# Patient Record
Sex: Male | Born: 1935 | Race: White | Hispanic: No | Marital: Married | Smoking: Never smoker
Health system: Southern US, Community
[De-identification: ages and names within clinical notes are randomized; demographics above are authoritative.]

## PROBLEM LIST (undated history)

## (undated) DIAGNOSIS — R0600 Dyspnea, unspecified: Secondary | ICD-10-CM

## (undated) DIAGNOSIS — I4891 Unspecified atrial fibrillation: Secondary | ICD-10-CM

## (undated) DIAGNOSIS — I10 Essential (primary) hypertension: Secondary | ICD-10-CM

## (undated) DIAGNOSIS — M109 Gout, unspecified: Secondary | ICD-10-CM

## (undated) DIAGNOSIS — I340 Nonrheumatic mitral (valve) insufficiency: Secondary | ICD-10-CM

## (undated) HISTORY — DX: Gout, unspecified: M10.9

## (undated) HISTORY — DX: Dyspnea, unspecified: R06.00

## (undated) HISTORY — DX: Unspecified atrial fibrillation: I48.91

## (undated) HISTORY — DX: Nonrheumatic mitral (valve) insufficiency: I34.0

## (undated) HISTORY — PX: INGUINAL HERNIA REPAIR: SUR1180

## (undated) HISTORY — PX: OTHER SURGICAL HISTORY: SHX169

## (undated) HISTORY — DX: Essential (primary) hypertension: I10

---

## 1996-02-21 HISTORY — PX: ESOPHAGOGASTRODUODENOSCOPY: SHX1529

## 2012-04-26 DIAGNOSIS — I10 Essential (primary) hypertension: Secondary | ICD-10-CM | POA: Insufficient documentation

## 2012-04-26 DIAGNOSIS — R0689 Other abnormalities of breathing: Secondary | ICD-10-CM | POA: Insufficient documentation

## 2012-04-26 DIAGNOSIS — R0600 Dyspnea, unspecified: Secondary | ICD-10-CM | POA: Insufficient documentation

## 2012-04-26 DIAGNOSIS — I4891 Unspecified atrial fibrillation: Secondary | ICD-10-CM | POA: Insufficient documentation

## 2012-04-26 DIAGNOSIS — I059 Rheumatic mitral valve disease, unspecified: Secondary | ICD-10-CM | POA: Insufficient documentation

## 2012-05-20 ENCOUNTER — Ambulatory Visit: Payer: Self-pay | Admitting: Cardiology

## 2012-06-15 DIAGNOSIS — Z5181 Encounter for therapeutic drug level monitoring: Secondary | ICD-10-CM | POA: Insufficient documentation

## 2012-08-14 ENCOUNTER — Encounter: Payer: Self-pay | Admitting: Cardiology

## 2012-08-20 ENCOUNTER — Encounter: Payer: Self-pay | Admitting: Cardiovascular Disease

## 2012-08-20 ENCOUNTER — Ambulatory Visit (INDEPENDENT_AMBULATORY_CARE_PROVIDER_SITE_OTHER): Payer: Medicare Other | Admitting: Cardiovascular Disease

## 2012-08-20 VITALS — BP 168/89 | HR 91 | Ht 70.0 in | Wt 189.8 lb

## 2012-08-20 DIAGNOSIS — I4891 Unspecified atrial fibrillation: Secondary | ICD-10-CM

## 2012-08-20 DIAGNOSIS — I10 Essential (primary) hypertension: Secondary | ICD-10-CM

## 2012-08-20 DIAGNOSIS — I059 Rheumatic mitral valve disease, unspecified: Secondary | ICD-10-CM

## 2012-08-20 DIAGNOSIS — I340 Nonrheumatic mitral (valve) insufficiency: Secondary | ICD-10-CM

## 2012-08-20 NOTE — Progress Notes (Signed)
CARDIOLOGY CONSULT NOTE  Patient ID: Caleb Ball MRN: [id number] DOB/AGE: 1935/09/09 77 y.o.  Admit date: (Not on file) Primary Physician: Dr. Sherril Croon Primary Cardiologist: Purvis Sheffield, MD Reason for Consultation: Atrial Fibrillation  HPI: Caleb Ball is a very pleasant 77 y.o. Male with a h/o Atrial Fibrillation, s/p TEE-guided cardioversion earlier this year. He has normal LV systolic function, mild LAE, mild-mod MR, mild LVH, mild to moderate mitral regurgitation, and mild TR. He has a h/o treated HTN.         He is here with his wife, Caleb Ball, of 60 years. He walks 2 miles 6 days a week without difficulty, denying chest pain, shortness of breath, dizziness, lightheadedness, palpitations, and syncope. He and his wife live in [place] and have 2 sons.         He ate a pork and egg sandwich for breakfast. He says his BP normally runs 139/60 mmHg.  Review of systems complete and found to be negative unless listed above in HPI  Past Medical History:  Family History  Problem Relation Age of Onset  . Heart attack Mother   . Heart disease Sister     Social History: married for 60 years, has two sons, exercises 6 days per week.    (Not in a hospital admission)  Physical exam Blood pressure 168/89, pulse 91, height 5\' 10"  (1.778 m), weight 189 lb 12.8 oz (86.093 kg). General: NAD Neck: No JVD, no thyromegaly or thyroid nodule.  Lungs: Clear to auscultation bilaterally with normal respiratory effort. CV: Nondisplaced PMI.  Heart regular S1/S2, no S3/S4, no murmur.  No peripheral edema.  No carotid bruit.  Normal pedal pulses.  Abdomen: Soft, nontender, no hepatosplenomegaly, no distention.  Skin: Intact without lesions or rashes.  Neurologic: Alert and oriented x 3.  Psych: Normal affect. Extremities: No clubbing or cyanosis.  HEENT: Normal.   Labs:   No results found for this basename: WBC, HGB, HCT, MCV, PLT   No results found for this basename: NA, K, CL, CO2, BUN,  CREATININE, CALCIUM, LABALBU, PROT, BILITOT, ALKPHOS, ALT, AST, GLUCOSE,  in the last 168 hours No results found for this basename: CKTOTAL, CKMB, CKMBINDEX, TROPONINI    No results found for this basename: CHOL   No results found for this basename: HDL   No results found for this basename: LDLCALC   No results found for this basename: TRIG   No results found for this basename: CHOLHDL   No results found for this basename: LDLDIRECT       EKG: Sinus rhythm, rate 91 bpm, axis within normal limits, intervals within normal limits, no acute ST-T wave changes.     ASSESSMENT AND PLAN:  1. Atrial Fibrillation-He is currently in NSR, and feels well. He takes ASA 81 mg daily and long-acting Cardizem. At this point, he would prefer to stay off of anticoagulation but has agreed to discuss it with his wife and get back to me. Xarelto was expensive for him, but he would consider Warfarin. His CHA2DS2-VASc score is 3.2%, making him at moderate to high risk for a future stroke, which was discussed with him. 2. HTN-uncontrolled, but believes it is normal at home and would prefer not to add an additional medication at this time. He has agreed to have his BP checked and if it is still elevated, we can add another antihypertensive at that time. 3. Mitral regurgitation-will continue to monitor.  I spent 45 minutes discussing the patient's diagnoses and risks/benefits of  further management strategies.  Signed: Prentice Docker, MD, [facility]   08/20/2012, 8:33 AM

## 2012-08-20 NOTE — Patient Instructions (Addendum)
   Continue all current medications.  Patient will call back when deciding about anticoagulation therapy.    Your physician wants you to follow up in: 6 months.  You will receive a reminder letter in the mail one-two months in advance.  If you don't receive a letter, please call our office to schedule the follow up appointment

## 2012-09-05 ENCOUNTER — Telehealth: Payer: Self-pay | Admitting: *Deleted

## 2012-09-05 NOTE — Telephone Encounter (Signed)
Wife Harriett Sine) called to inform Dr. Purvis Sheffield that husband has decided to go with the Coumadin.

## 2012-09-06 NOTE — Telephone Encounter (Signed)
Please initiate Warfarin and have him set up in the Warfarin clinic for INR monitoring.

## 2012-09-06 NOTE — Telephone Encounter (Signed)
Left message to return call 

## 2012-09-09 NOTE — Telephone Encounter (Signed)
Talked with pt wife, and scheduled him an appointment with coumadin clinic for Tuesday 7-22 at [facility]

## 2012-09-10 ENCOUNTER — Ambulatory Visit (INDEPENDENT_AMBULATORY_CARE_PROVIDER_SITE_OTHER): Payer: Self-pay | Admitting: *Deleted

## 2012-09-10 DIAGNOSIS — I4891 Unspecified atrial fibrillation: Secondary | ICD-10-CM

## 2012-09-10 DIAGNOSIS — Z7901 Long term (current) use of anticoagulants: Secondary | ICD-10-CM

## 2012-09-10 LAB — POCT INR: INR: 1

## 2012-09-10 MED ORDER — WARFARIN SODIUM 5 MG PO TABS: 5.0000 mg | ORAL_TABLET | Freq: Every day | ORAL | Status: DC

## 2012-09-10 NOTE — Patient Instructions (Addendum)

## 2012-09-11 ENCOUNTER — Telehealth: Payer: Self-pay | Admitting: *Deleted

## 2012-09-11 NOTE — Telephone Encounter (Signed)
Wife states they have decided to not start the coumadin until after the consultation with Dr Dorris Fetch regarding need for colonoscopy, the risks have been reviewed with them from yesterdays anticoagulation visit.

## 2012-09-20 HISTORY — PX: OTHER SURGICAL HISTORY: SHX169

## 2012-11-07 ENCOUNTER — Ambulatory Visit: Payer: Self-pay | Admitting: *Deleted

## 2012-11-07 DIAGNOSIS — Z7901 Long term (current) use of anticoagulants: Secondary | ICD-10-CM

## 2012-11-07 DIAGNOSIS — I4891 Unspecified atrial fibrillation: Secondary | ICD-10-CM

## 2013-06-10 ENCOUNTER — Telehealth: Payer: Self-pay | Admitting: Cardiovascular Disease

## 2013-06-10 NOTE — Telephone Encounter (Signed)
06/10/13-LM to call back to schedule off over due recall -srs

## 2013-08-07 ENCOUNTER — Encounter: Payer: Self-pay | Admitting: *Deleted

## 2013-08-20 ENCOUNTER — Encounter: Payer: Self-pay | Admitting: Cardiovascular Disease

## 2013-08-27 ENCOUNTER — Encounter: Payer: Self-pay | Admitting: Cardiovascular Disease

## 2013-08-27 ENCOUNTER — Encounter: Payer: Self-pay | Admitting: *Deleted

## 2013-08-27 ENCOUNTER — Ambulatory Visit (INDEPENDENT_AMBULATORY_CARE_PROVIDER_SITE_OTHER): Payer: Medicare Other | Admitting: Cardiovascular Disease

## 2013-08-27 VITALS — BP 156/71 | HR 92 | Ht 70.0 in | Wt 172.0 lb

## 2013-08-27 DIAGNOSIS — I4891 Unspecified atrial fibrillation: Secondary | ICD-10-CM

## 2013-08-27 DIAGNOSIS — I059 Rheumatic mitral valve disease, unspecified: Secondary | ICD-10-CM

## 2013-08-27 DIAGNOSIS — I480 Paroxysmal atrial fibrillation: Secondary | ICD-10-CM

## 2013-08-27 DIAGNOSIS — I10 Essential (primary) hypertension: Secondary | ICD-10-CM

## 2013-08-27 DIAGNOSIS — I340 Nonrheumatic mitral (valve) insufficiency: Secondary | ICD-10-CM

## 2013-08-27 NOTE — Patient Instructions (Signed)
Continue all current medications. Your physician wants you to follow up in:  1 year.  You will receive a reminder letter in the mail one-two months in advance.  If you don't receive a letter, please call our office to schedule the follow up appointment   

## 2013-08-27 NOTE — Progress Notes (Signed)
Patient ID: Caleb Ball, male   DOB: [birth year], 78 y.o.   MRN: [id number]      SUBJECTIVE: Caleb Ball is a very pleasant 78 y.o. male with a history of atrial fibrillation, s/p TEE-guided cardioversion in 2014. He has normal LV systolic function, mild LAE, mild LVH, mild to moderate mitral regurgitation, and mild TR. He has a history of treated HTN.  He is here with his wife, Caleb Ball, of 49 years. He walks 1.5 miles in 30 minutes six days a week without difficulty, denying chest pain, shortness of breath, dizziness, lightheadedness, palpitations, and syncope.  He and his wife live in [place] and have 2 sons.  He has had no problems with warfarin and this is managed by his primary care physician.  ECG performed in the office today demonstrates normal sinus rhythm.  He says his blood pressure is normally well controlled with systolic readings typically in the 130 range and diastolic readings typically in the 60-70 range.  His 76 year old brother recently had to undergo four-vessel bypass surgery and valve replacement.  No Known Allergies  Current Outpatient Prescriptions  Medication Sig Dispense Refill  . allopurinol (ZYLOPRIM) 300 MG tablet Take 450 mg by mouth daily.       Marland Kitchen aspirin 81 MG tablet Take 81 mg by mouth daily.      . colchicine 0.6 MG tablet Take 0.6 mg by mouth daily.      Marland Kitchen diltiazem (CARDIZEM CD) 300 MG 24 hr capsule Take 300 mg by mouth daily.      Marland Kitchen glucosamine-chondroitin 500-400 MG tablet Take 1 tablet by mouth 2 (two) times daily.      . Omega-3 Fatty Acids (SUPER OMEGA 3 EPA/DHA PO) Take 1 g by mouth 2 (two) times daily.       . ranitidine (ZANTAC) 150 MG tablet Take 150 mg by mouth daily.      . tamsulosin (FLOMAX) 0.4 MG CAPS capsule Take 1 capsule by mouth daily.      . vitamin B-12 (CYANOCOBALAMIN) 1000 MCG tablet Take 1,000 mcg by mouth daily.      Marland Kitchen warfarin (COUMADIN) 5 MG tablet Take 10 mg by mouth daily.       No current facility-administered  medications for this visit.    Past Medical History  Diagnosis Date  . Atrial fibrillation   . Mitral regurgitation   . Dyspnea     Past Surgical History  Procedure Laterality Date  . Inguinal hernia repair      Left  . Left olecranon bursa resection      History   Social History  . Marital Status: Married    Spouse Name: N/A    Number of Children: N/A  . Years of Education: N/A   Occupational History  . Not on file.   Social History Main Topics  . Smoking status: Never Smoker   . Smokeless tobacco: Never Used  . Alcohol Use: Yes  . Drug Use: Not on file  . Sexual Activity: Not on file   Other Topics Concern  . Not on file   Social History Narrative  . No narrative on file     Filed Vitals:   08/27/13 0855  BP: 156/71  Pulse: 92  Height: 5\' 10"  (1.778 m)  Weight: 172 lb (78.019 kg)    PHYSICAL EXAM General: NAD Neck: No JVD, no thyromegaly. Lungs: Clear to auscultation bilaterally with normal respiratory effort. CV: Nondisplaced PMI.  Regular rate and rhythm, normal S1/S2, no  S3/S4, II/VI holosystolic murmur along left sternal border and apex. No pretibial or periankle edema.  No carotid bruit.  Normal pedal pulses.  Abdomen: Soft, nontender, no hepatosplenomegaly, no distention.  Neurologic: Alert and oriented x 3.  Psych: Normal affect. Extremities: No clubbing or cyanosis.   ECG: reviewed and available in electronic records.      ASSESSMENT AND PLAN: 1. Atrial Fibrillation-He is currently in NSR, and feels well. He is on long-acting Cardizem and warfarin.   2. HTN-Appears to be controlled at home. I will not make any adjustments today. 3. Mitral regurgitation-No evidence of heart failure. Will continue to monitor.  Dispo: f/u 1 year.  Kate Sable, M.D., F.A.C.C.

## 2013-09-09 ENCOUNTER — Other Ambulatory Visit: Payer: Self-pay | Admitting: Gastroenterology

## 2013-09-09 ENCOUNTER — Ambulatory Visit (INDEPENDENT_AMBULATORY_CARE_PROVIDER_SITE_OTHER): Payer: Medicare Other | Admitting: Gastroenterology

## 2013-09-09 ENCOUNTER — Encounter: Payer: Self-pay | Admitting: Gastroenterology

## 2013-09-09 VITALS — BP 145/80 | HR 94 | Temp 97.6°F | Resp 20 | Ht 69.0 in | Wt 173.0 lb

## 2013-09-09 DIAGNOSIS — R945 Abnormal results of liver function studies: Secondary | ICD-10-CM

## 2013-09-09 DIAGNOSIS — R7989 Other specified abnormal findings of blood chemistry: Secondary | ICD-10-CM

## 2013-09-09 DIAGNOSIS — R160 Hepatomegaly, not elsewhere classified: Secondary | ICD-10-CM

## 2013-09-09 DIAGNOSIS — R197 Diarrhea, unspecified: Secondary | ICD-10-CM

## 2013-09-09 DIAGNOSIS — K529 Noninfective gastroenteritis and colitis, unspecified: Secondary | ICD-10-CM | POA: Insufficient documentation

## 2013-09-09 NOTE — Assessment & Plan Note (Signed)
Hepatomegaly noted on exam. Recommend abdominal ultrasound. Patient states he had mildly elevated LFTs previously.

## 2013-09-09 NOTE — Assessment & Plan Note (Signed)
78 year old with chronic intermittent diarrhea for years, more persistent recently. The past one year. Symptoms self limiting with one to 2 loose stools every morning. Takes Imodium on occasion. Generally if he has a long car ride. No alarm symptoms such as blood in the stool, nocturnal symptoms. No unintentional weight loss. In May he had mild anemia but his hemoglobin was normal earlier this month. Colonoscopy performed last year by Dr. Anthony Sar, not clear whether he random colon biopsies. We have requested records.  Suspect bowel function data related to dairy consumption at night, medication, functional. Discussed limited workup initially. Consider stopping vitamin B12 for a few weeks to see if this helps. His other medications (which can be associated with diarrhea) are much needed in would not recommend stopping at this time.

## 2013-09-09 NOTE — Progress Notes (Signed)
Primary Care Physician:  Glenda Chroman., MD  Primary Gastroenterologist:  Garfield Cornea, MD   Chief Complaint  Patient presents with  . Diarrhea    HPI:  Caleb Ball is a 78 y.o. male here for further evaluation of chronic diarrhea at the request of Dr. Woody Seller.   Chronic diarrhea off and on for years but lately more regularly. Colonoscopy 09/2012 last year while having diarrhea but nothing found. Eat yogurt and ice cream at night, ?bringing on the diarrhea in the mornings. No melena, brbpr. Used to be regular just in the morning, X1. Now, may have to go back couple of times but fine rest of the day. Stools looser. No constipation. For years, has been using antidiarrheal as needed. Has lost 25 pounds over the past two years, intentional. Dietary changes and exercising 6 days per week. Denies any abdominal pain. Appetite is good. No vomiting. No dysphagia. Heartburn controlled on Zantac. Stopped Prilosec couple years ago because he was concerned about the risk of chronic use. Apparently Dr. Gala Romney did an EGD in 1998 and he had esophagitis and had his esophagus dilated per patient. Those records are unavailable at the time of this dictation.   Patient is concerned his medications may be contributing to his diarrhea. He has been on allopurinol and colchicine for 3-4 years. Has been on Coumadin for a couple of years. Takes vitamin B12 on his own, no recommendations by a physician.     Current Outpatient Prescriptions  Medication Sig Dispense Refill  . allopurinol (ZYLOPRIM) 300 MG tablet Take 450 mg by mouth daily.       Marland Kitchen aspirin 81 MG tablet Take 81 mg by mouth daily.      . colchicine 0.6 MG tablet Take 0.6 mg by mouth daily.      Marland Kitchen diltiazem (CARDIZEM CD) 300 MG 24 hr capsule Take 300 mg by mouth daily.      Marland Kitchen glucosamine-chondroitin 500-400 MG tablet Take 1 tablet by mouth 2 (two) times daily.      Marland Kitchen loperamide (IMODIUM) 2 MG capsule Take 2 mg by mouth as needed for diarrhea or loose  stools.      . Omega-3 Fatty Acids (SUPER OMEGA 3 EPA/DHA PO) Take 1 g by mouth 2 (two) times daily.       . ranitidine (ZANTAC) 150 MG tablet Take 150 mg by mouth daily.      . tamsulosin (FLOMAX) 0.4 MG CAPS capsule Take 1 capsule by mouth daily.      . vitamin B-12 (CYANOCOBALAMIN) 1000 MCG tablet Take 1,000 mcg by mouth daily.      Marland Kitchen warfarin (COUMADIN) 5 MG tablet Take 10 mg by mouth daily.       No current facility-administered medications for this visit.    Allergies as of 09/09/2013  . (No Known Allergies)    Past Medical History  Diagnosis Date  . Atrial fibrillation   . Mitral regurgitation   . Dyspnea   . Gout   . HTN (hypertension)     Past Surgical History  Procedure Laterality Date  . Inguinal hernia repair      Left  . Left olecranon bursa resection    . Esophagogastroduodenoscopy  1998    Dr. Gala Romney: [facility], distal esophageal erosions  . Colonoscopy  09/2012    Dr. Anthony Sar: diverticulosis    Family History  Problem Relation Age of Onset  . Heart attack Mother   . Heart disease Sister   . Colon cancer  Neg Hx     History   Social History  . Marital Status: Married    Spouse Name: N/A    Number of Children: N/A  . Years of Education: N/A   Occupational History  . Not on file.   Social History Main Topics  . Smoking status: Never Smoker   . Smokeless tobacco: Never Used  . Alcohol Use: Yes     Comment: To 5 ounce glasses of red wine each night  . Drug Use: No  . Sexual Activity: Not on file   Other Topics Concern  . Not on file   Social History Narrative  . No narrative on file      ROS:  General: Negative for anorexia, unintentional weight loss, fever, chills, fatigue, weakness. Eyes: Negative for vision changes.  ENT: Negative for hoarseness, difficulty swallowing , nasal congestion. CV: Negative for chest pain, angina, palpitations, dyspnea on exertion, peripheral edema.  Respiratory: Negative for dyspnea at rest, dyspnea on  exertion, cough, sputum, wheezing.  GI: See history of present illness. GU:  Negative for dysuria, hematuria, urinary incontinence, urinary frequency, nocturnal urination.  MS: Negative for joint pain, low back pain.  Derm: Negative for rash or itching.  Neuro: Negative for weakness, abnormal sensation, seizure, frequent headaches, memory loss, confusion.  Psych: Negative for anxiety, depression, suicidal ideation, hallucinations.  Endo: Negative for unusual weight change.  Heme: Negative for bruising or bleeding. Allergy: Negative for rash or hives.    Physical Examination:  BP 145/80  Pulse 94  Temp(Src) 97.6 F (36.4 C) (Oral)  Resp 20  Ht 5\' 9"  (1.753 m)  Wt 173 lb (78.472 kg)  BMI 25.54 kg/m2   General: Well-nourished, well-developed in no acute distress.  Head: Normocephalic, atraumatic.   Eyes: Conjunctiva pink, no icterus. Mouth: Oropharyngeal mucosa moist and pink , no lesions erythema or exudate. Neck: Supple without thyromegaly, masses, or lymphadenopathy.  Lungs: Clear to auscultation bilaterally.  Heart: Regular rate and rhythm, no murmurs rubs or gallops.  Abdomen: Bowel sounds are normal, nontender, nondistended, no splenomegaly or masses, no abdominal bruits or    hernia , no rebound or guarding.  Liver edge palpable 9 fingerbreadths below the right costal margin in the midclavicular line. Left hepatic lobe extended across to the left abdomen. Mild rectus diastases. Rectal: Not performed Extremities: No lower extremity edema. No clubbing or deformities.  Neuro: Alert and oriented x 4 , grossly normal neurologically.  Skin: Warm and dry, no rash or jaundice.   Psych: Alert and cooperative, normal mood and affect.  Labs: Labs from May 2015 TSH 5.5 high, white blood cell count 6200, hemoglobin 12.3 slightly low, hematocrit 37.3 slightly low, platelets 393,000, hemoglobin A1c 5.1  08/20/13: Hgb normal 12.6, Hct38.1.  Imaging Studies: No results found.

## 2013-09-09 NOTE — Patient Instructions (Addendum)
1. The following medications can be associated with diarrhea: allopurinol, colchicine (frequently), zantac, coumadin, vitamin B12. Consider stopping B12 for few weeks to see if makes any difference. The other medications provide more benefits and would not advise stopping at this time. 2. Please have your labs, stools, ultrasound done. We will call you with results. 3. We will review copy of your last colonoscopy when available.

## 2013-09-11 LAB — GIARDIA/CRYPTOSPORIDIUM (EIA)
Cryptosporidium Screen (EIA): NEGATIVE
Giardia Screen (EIA): NEGATIVE

## 2013-09-11 LAB — CLOSTRIDIUM DIFFICILE BY PCR: Toxigenic C. Difficile by PCR: NOT DETECTED

## 2013-09-11 NOTE — Progress Notes (Signed)
Quick Note:  Await other stools, labs. ______

## 2013-09-11 NOTE — Progress Notes (Signed)
Reviewed records.  Colonoscopy August 2014 by Dr. Anthony Sar, moderate diverticulosis of the sigmoid colon. No biopsies taken.  EGD by Dr. Anthony Sar June 1994, EGD with removal of foreign body in the esophagus (food) no other abnormalities such as stricture.  Await stools, labs , u/s.

## 2013-09-14 LAB — STOOL CULTURE

## 2013-09-15 ENCOUNTER — Ambulatory Visit: Payer: Medicare Other | Admitting: Gastroenterology

## 2013-09-16 ENCOUNTER — Telehealth: Payer: Self-pay | Admitting: Gastroenterology

## 2013-09-16 DIAGNOSIS — R160 Hepatomegaly, not elsewhere classified: Secondary | ICD-10-CM

## 2013-09-16 DIAGNOSIS — E538 Deficiency of other specified B group vitamins: Secondary | ICD-10-CM

## 2013-09-16 DIAGNOSIS — R945 Abnormal results of liver function studies: Secondary | ICD-10-CM

## 2013-09-16 DIAGNOSIS — R7989 Other specified abnormal findings of blood chemistry: Secondary | ICD-10-CM

## 2013-09-16 NOTE — Telephone Encounter (Signed)
Received from [facility] dated 09/11/2013, copy of recent abdominal U/S I recently ordered.  Right lobe of liver posteriorly, large inhomogenous mass present measuring 14.1x17.7x17 cm. Some blood flow within the mass noted but not typical of a giant cavernous hemangioma by ultrasound. Hepatocellular carcinoma this possibility. MRI of the liver recommended.  Patient needs MRI abdomen, liver protocol for large right hepatic lobe mass seen 09/11/13 u/s.   Needs labs CMET, TTG, IgA prior to.   Tried to call patient. No answer/no machine.  Please let me know when you have reached patient so I can discuss findings with him.

## 2013-09-16 NOTE — Progress Notes (Signed)
Quick Note:  Stools neg. Did he have labs done at [facility] See telephone note from today. ______

## 2013-09-16 NOTE — Progress Notes (Signed)
cc'd to pcp 

## 2013-09-17 ENCOUNTER — Other Ambulatory Visit: Payer: Self-pay | Admitting: Gastroenterology

## 2013-09-17 DIAGNOSIS — R160 Hepatomegaly, not elsewhere classified: Secondary | ICD-10-CM

## 2013-09-17 NOTE — Telephone Encounter (Signed)
Received copy of labs done on 09/09/2013 at [facility]. Total bilirubin 0.6, alkaline phosphatase 157, AST 54, ALT 39, albumin 3.5, TSH 4.830, TTG IgA less than 2, TTG IgG 2.  Patient had BUN of 12, Creatinine 0.64 on 08/20/13.  No further labs needed right now.   Tried to call patient, left msg at home number, multiple rings but no answer on cell phone.   Please get him scheduled for MRI abd, liver protocol.

## 2013-09-17 NOTE — Telephone Encounter (Signed)
Lab orders on file. 

## 2013-09-17 NOTE — Telephone Encounter (Signed)
Mri Is scheduled at [facility] on Monday Aug 3rd at 8:30 am and Caleb Ball is aware

## 2013-09-17 NOTE — Telephone Encounter (Signed)
Spoke to patient and wife. He saw his PCP for routine f/u and was advised of u/s findings. They mentioned arranging for MRI but we should do this so we can ensure appropriate study ordered ie contrast issues.  Please call Dr. Woody Seller' office and let them know that we will order MRI Abd (liver protocol). They would like it done at [facility]. See rest of this phone note for details.  Patient needs TSH, free T4, vitamin B12 level, hep B surface Antigen and HCV antibody done in 4 weeks. ([id number] TSH, abnormal LFTs, h/o B12 def, liver mass).

## 2013-09-17 NOTE — Telephone Encounter (Signed)
I spoke with Otila Kluver at Dr.Vyas's office. She will let Suanne Marker (their scheduler) and Dr.Vyas know that we are going to order the MRI.  She said she could see where he had ordered it and will have them cancel it.   Leighann, please see LSL instructions below and order MRI at [facility].

## 2013-09-19 NOTE — Telephone Encounter (Signed)
Caleb Ball, can we have patient go ahead and get AFP tumor marker done. If he agrees, we can go ahead and do the hep b surf ag, hcv ab, b12 now, but we will still need to waith on tsh, Ball t4 for four weeks.

## 2013-09-23 ENCOUNTER — Telehealth: Payer: Self-pay

## 2013-09-23 NOTE — Telephone Encounter (Signed)
Pt's wife called this morning to see about the MRI results. I told her that they was not back yet and as soon as we had them we would call them.

## 2013-09-23 NOTE — Telephone Encounter (Signed)
Let's check with [facility]. That's where he had it done. See if they can fax to [place] ASAP.

## 2013-09-23 NOTE — Telephone Encounter (Signed)
Report on [facility] desk

## 2013-09-23 NOTE — Telephone Encounter (Signed)
They are faxing the report over now.

## 2013-09-24 ENCOUNTER — Other Ambulatory Visit: Payer: Self-pay

## 2013-09-24 ENCOUNTER — Other Ambulatory Visit: Payer: Self-pay | Admitting: Gastroenterology

## 2013-09-24 DIAGNOSIS — E538 Deficiency of other specified B group vitamins: Secondary | ICD-10-CM

## 2013-09-24 DIAGNOSIS — R7989 Other specified abnormal findings of blood chemistry: Secondary | ICD-10-CM

## 2013-09-24 DIAGNOSIS — R945 Abnormal results of liver function studies: Secondary | ICD-10-CM

## 2013-09-24 DIAGNOSIS — R160 Hepatomegaly, not elsewhere classified: Secondary | ICD-10-CM

## 2013-09-24 NOTE — Telephone Encounter (Signed)
Orders for a Liver Bx to be done at [facility] has been made

## 2013-09-24 NOTE — Telephone Encounter (Signed)
MRI received from [facility] dated 8/3.  Impression: 18 cm right hepatic lobe mass. If patient has history of cirrhosis, this meets imaging criteria for HCC. If patient does not have a diagnosis of cirrhosis, ultrasound-guided percutaneous needle biopsy is recommended for diagnosis. NO evidence of abdominal metastatic disease.   I contacted Caleb Crigler, PA with Oncology for the best route to take. Agrees with biopsy of lesion. Agrees with AFP. Recommends CT chest/pelvis to rule out other lesions. Unclear if this is a primary or not.   I spoke with patient and wife. WIFE AND PATIENT TO DISCUSS TODAY AND CALL BACK TOMORROW, 8/6. THE PATIENT IS HESITANT TO DO ANYTHING. He is not sure he even wants to proceed with a biopsy. Awaiting their input before setting up liver biopsy.

## 2013-09-24 NOTE — Telephone Encounter (Signed)
Pt's wife called and wanted to schedule the liver bx. Darius Bump is working on that and I told they would call her. I also told her that he needs to go to the lab for a AFP. We are going to fax the order to [facility] because that is where they would like to go. I asked her to let them know to fax [place] the report.

## 2013-09-24 NOTE — Telephone Encounter (Signed)
Ginger and Vicente Males spoke with pts wife. Lab orders have been faxed to [facility].

## 2013-09-24 NOTE — Progress Notes (Signed)
MRI received from [facility] dated 8/3.  Impression: 18 cm right hepatic lobe mass. If patient has history of cirrhosis, this meets imaging criteria for HCC. If patient does not have a diagnosis of cirrhosis, ultrasound-guided percutaneous needle biopsy is recommended for diagnosis. NO evidence of abdominal metastatic disease.   I contacted Kirby Crigler, PA with Oncology for the best route to take. Agrees with biopsy of lesion. Agrees with AFP. Recommends CT chest/pelvis to rule out other lesions. Unclear if this is a primary or not.   I spoke with patient and wife. WIFE AND PATIENT TO DISCUSS TODAY AND CALL BACK TOMORROW, 8/6. THE PATIENT IS HESITANT TO DO ANYTHING. He is not sure he even wants to proceed with a biopsy. Awaiting their input before setting up liver biopsy.

## 2013-09-26 NOTE — Telephone Encounter (Signed)
PT's wife would like to talk with LSL when she get back next week. She asked if LSL would call her.

## 2013-09-29 NOTE — Telephone Encounter (Signed)
Chelsey, can you please call [facility] and get the labs we ordered, please?

## 2013-09-29 NOTE — Telephone Encounter (Signed)
Per Darius Bump she spoke with central scheduling at [facility]([phone]) and they stated they were waiting on a response from Dr. Philipp Deputy office on wether or not the patient can hold his blood thinner prior to his Liver Bx.

## 2013-09-29 NOTE — Telephone Encounter (Signed)
Dawn from [facility] records states they have the labs and she will send them over.

## 2013-09-29 NOTE — Telephone Encounter (Signed)
Spoke with wife. See other phone note with today's entry.

## 2013-09-29 NOTE — Telephone Encounter (Signed)
The most recent labs pt had done at [facility] was 08/21/2013, [place] from [facility] medical records faxed 08/21/13 labs.

## 2013-09-29 NOTE — Telephone Encounter (Signed)
Pt's wife called demanding to speak with Dr. Gala Romney or Magda Paganini she does not want to speak with the nurse, per wife they have been left hanging and she wants to know what is it that they need to do because pt has a spot on his liver and that is serious and pt's wife stated if she is not going to hear anything from [facility] or Dr. Gala Romney do they need to find another doctor, I made pt's wife aware that no one is in the office today and I can send them a message pt's wife verbally agreed and said she wanted to hear something. Please advise [phone]

## 2013-09-29 NOTE — Telephone Encounter (Signed)
I called the patient's wife and explained to her that we were waiting on [facility] to send in the information to scheduling department on if the patient can hold his blood thinner.  I gave Mrs. Ogarro's Peggy's number in the scheduling department to follow-up with her, if she doesn't hear anything today.  Patient seemed to understand what we are waiting on.

## 2013-09-29 NOTE — Telephone Encounter (Signed)
Peggy with scheduling([phone]) is working on getting the patient scheduled for his Liver Bx.

## 2013-09-29 NOTE — Telephone Encounter (Signed)
I've received the labs from [facility] and placed them in LL's result box.

## 2013-09-29 NOTE — Telephone Encounter (Signed)
I spoke with patient's wife. Discussed labs. AFP slightly elevated at 9.2. Discussed need for chest/pelvic CT but she and patient want to weight until liver biopsy done. They will contact [place] with scheduling or Dr. Woody Seller if not scheduled by later today/tomorrow. They will call us if further questions.

## 2013-10-02 ENCOUNTER — Other Ambulatory Visit: Payer: Self-pay

## 2013-10-02 DIAGNOSIS — R7989 Other specified abnormal findings of blood chemistry: Secondary | ICD-10-CM

## 2013-10-03 ENCOUNTER — Other Ambulatory Visit: Payer: Self-pay | Admitting: Radiology

## 2013-10-06 ENCOUNTER — Telehealth: Payer: Self-pay | Admitting: *Deleted

## 2013-10-06 ENCOUNTER — Ambulatory Visit (HOSPITAL_COMMUNITY): Payer: Medicare Other

## 2013-10-06 ENCOUNTER — Other Ambulatory Visit: Payer: Self-pay | Admitting: Radiology

## 2013-10-06 ENCOUNTER — Encounter (HOSPITAL_COMMUNITY): Payer: Self-pay | Admitting: Pharmacy Technician

## 2013-10-06 NOTE — Telephone Encounter (Signed)
Pt's wife called wanting to speak with the nurse she has some questions she wants the nurse to answer please advise [phone]

## 2013-10-06 NOTE — Telephone Encounter (Signed)
Tried to call with no answer  

## 2013-10-06 NOTE — Telephone Encounter (Signed)
Wife would like to wait on the blood work until the bx on his liver.

## 2013-10-07 ENCOUNTER — Ambulatory Visit (HOSPITAL_COMMUNITY)
Admission: RE | Admit: 2013-10-07 | Discharge: 2013-10-07 | Disposition: A | Discharge status: Home / Self Care | Payer: Medicare Other | Source: Ambulatory Visit | Attending: Gastroenterology | Admitting: Gastroenterology

## 2013-10-07 ENCOUNTER — Encounter (HOSPITAL_COMMUNITY): Payer: Self-pay

## 2013-10-07 DIAGNOSIS — R160 Hepatomegaly, not elsewhere classified: Secondary | ICD-10-CM

## 2013-10-07 DIAGNOSIS — K769 Liver disease, unspecified: Secondary | ICD-10-CM | POA: Insufficient documentation

## 2013-10-07 LAB — CBC
HCT: 35.4 % — ABNORMAL LOW (ref 39.0–52.0)
Hemoglobin: 12 g/dL — ABNORMAL LOW (ref 13.0–17.0)
MCH: 32.1 pg (ref 26.0–34.0)
MCHC: 33.9 g/dL (ref 30.0–36.0)
MCV: 94.7 fL (ref 78.0–100.0)
PLATELETS: 417 10*3/uL — AB (ref 150–400)
RBC: 3.74 MIL/uL — AB (ref 4.22–5.81)
RDW: 14.8 % (ref 11.5–15.5)
WBC: 5.7 10*3/uL (ref 4.0–10.5)

## 2013-10-07 LAB — PROTIME-INR
INR: 1.01 (ref 0.00–1.49)
Prothrombin Time: 13.3 seconds (ref 11.6–15.2)

## 2013-10-07 LAB — APTT: aPTT: 33 seconds (ref 24–37)

## 2013-10-07 MED ORDER — LIDOCAINE HCL (PF) 1 % IJ SOLN
INTRAMUSCULAR | Status: DC
Start: 2013-10-07 — End: 2013-10-08
  Filled 2013-10-07: qty 5

## 2013-10-07 MED ORDER — GELATIN ABSORBABLE 12-7 MM EX MISC
CUTANEOUS | Status: DC
Start: 2013-10-07 — End: 2013-10-08
  Filled 2013-10-07: qty 1

## 2013-10-07 MED ORDER — MIDAZOLAM HCL 2 MG/2ML IJ SOLN
INTRAMUSCULAR | Status: AC | PRN
  Administered 2013-10-07: 0.5 mg via INTRAVENOUS
  Administered 2013-10-07: 1 mg via INTRAVENOUS

## 2013-10-07 MED ORDER — SODIUM CHLORIDE 0.9 % IV SOLN
INTRAVENOUS | Status: AC | PRN
  Administered 2013-10-07: 10 mL/h via INTRAVENOUS

## 2013-10-07 MED ORDER — MIDAZOLAM HCL 2 MG/2ML IJ SOLN
INTRAMUSCULAR | Status: AC
  Filled 2013-10-07: qty 4

## 2013-10-07 MED ORDER — HYDROCODONE-ACETAMINOPHEN 5-325 MG PO TABS: 1.0000 | ORAL_TABLET | ORAL | Status: DC | PRN

## 2013-10-07 MED ORDER — SODIUM CHLORIDE 0.9 % IV SOLN
INTRAVENOUS | Status: DC
  Administered 2013-10-07: 13:00:00 via INTRAVENOUS

## 2013-10-07 MED ORDER — LIDOCAINE HCL (PF) 1 % IJ SOLN
INTRAMUSCULAR | Status: AC
  Filled 2013-10-07: qty 5

## 2013-10-07 MED ORDER — FENTANYL CITRATE 0.05 MG/ML IJ SOLN
INTRAMUSCULAR | Status: DC
Start: 2013-10-07 — End: 2013-10-08
  Filled 2013-10-07: qty 2

## 2013-10-07 MED ORDER — FENTANYL CITRATE 0.05 MG/ML IJ SOLN
INTRAMUSCULAR | Status: AC | PRN
  Administered 2013-10-07: 25 ug via INTRAVENOUS
  Administered 2013-10-07: 12.5 ug via INTRAVENOUS

## 2013-10-07 NOTE — Sedation Documentation (Signed)
Patient denies pain and is resting comfortably.  

## 2013-10-07 NOTE — Procedures (Signed)
US biopsy Liver mass No comp

## 2013-10-07 NOTE — H&P (Signed)
Caleb Ball is an 78 y.o. male.   Chief Complaint: Pt visits his PMD for evaluation of diarrhea x 6 weeks ago (This has resolved mostly) Wt loss x several months Upon exam--noted abdominal distension; pain [place] and MRI reveal large liver lesion AFP 9.2 according to office notes Scheduled now for liver lesion biopsy Has been off coumadin x 1 week  HPI: afib; mitral regurg; HTN  Past Medical History  Diagnosis Date  . Atrial fibrillation   . Mitral regurgitation   . Dyspnea   . Gout   . HTN (hypertension)     Past Surgical History  Procedure Laterality Date  . Inguinal hernia repair      Left  . Left olecranon bursa resection    . Esophagogastroduodenoscopy  1998    Dr. Gala Romney: [facility], distal esophageal erosions  . Colonoscopy  09/2012    Dr. Anthony Sar: diverticulosis    Family History  Problem Relation Age of Onset  . Heart attack Mother   . Heart disease Sister   . Colon cancer Neg Hx    Social History:  reports that he has never smoked. He has never used smokeless tobacco. He reports that he drinks alcohol. He reports that he does not use illicit drugs.  Allergies: No Known Allergies   (Not in a hospital admission)  Results for orders placed during the hospital encounter of 10/07/13 (from the past 48 hour(s))  APTT     Status: None   Collection Time    10/07/13 12:08 PM      Result Value Ref Range   aPTT 33  24 - 37 seconds  CBC     Status: Abnormal   Collection Time    10/07/13 12:08 PM      Result Value Ref Range   WBC 5.7  4.0 - 10.5 K/uL   RBC 3.74 (*) 4.22 - 5.81 MIL/uL   Hemoglobin 12.0 (*) 13.0 - 17.0 g/dL   HCT 35.4 (*) 39.0 - 52.0 %   MCV 94.7  78.0 - 100.0 fL   MCH 32.1  26.0 - 34.0 pg   MCHC 33.9  30.0 - 36.0 g/dL   RDW 14.8  11.5 - 15.5 %   Platelets 417 (*) 150 - 400 K/uL  PROTIME-INR     Status: None   Collection Time    10/07/13 12:08 PM      Result Value Ref Range   Prothrombin Time 13.3  11.6 - 15.2 seconds   INR 1.01  0.00 - 1.49    No results found.  Review of Systems  Constitutional: Positive for weight loss. Negative for fever and chills.  Respiratory: Positive for shortness of breath.   Cardiovascular: Negative for chest pain.  Gastrointestinal: Positive for abdominal pain and diarrhea. Negative for nausea and vomiting.  Musculoskeletal: Negative for back pain.  Neurological: Positive for weakness. Negative for headaches.  Psychiatric/Behavioral: Negative for substance abuse.    Blood pressure 151/51, pulse 76, temperature 98.5 F (36.9 C), temperature source Oral, resp. rate 18, height 5\' 10"  (1.778 m), weight 78.019 kg (172 lb), SpO2 96.00%. Physical Exam  Constitutional: He is oriented to person, place, and time. He appears well-nourished.  Cardiovascular: Normal rate.   No murmur heard. Irreg  Respiratory: Effort normal and breath sounds normal. He has no wheezes.  GI: Soft. Bowel sounds are normal. There is tenderness.  Musculoskeletal: Normal range of motion.  Neurological: He is alert and oriented to person, place, and time.  Skin: Skin  is warm and dry.  Psychiatric: He has a normal mood and affect. His behavior is normal. Judgment and thought content normal.     Assessment/Plan Large liver lesion AFP 9.2 Pt scheduled for liver lesion bx Pt aware of procedure benefits and risks and agreeable to proceed Consent signed andin chart  Jayen Bromwell A 10/07/2013, 1:32 PM

## 2013-10-07 NOTE — Discharge Instructions (Signed)

## 2013-10-08 ENCOUNTER — Encounter: Payer: Self-pay | Admitting: Gastroenterology

## 2013-10-10 ENCOUNTER — Other Ambulatory Visit: Payer: Self-pay | Admitting: Gastroenterology

## 2013-10-10 DIAGNOSIS — C220 Liver cell carcinoma: Secondary | ICD-10-CM

## 2013-10-10 NOTE — Progress Notes (Signed)
Quick Note:  Spoke with patient's wife per his request. Informed of [place] on path. Discussed with Dr. Gala Romney. He recommends urgent oncology consultation. They want to be seen in [facility] by Dr. Tressie Stalker. Please request urgent OV. ______

## 2013-10-14 ENCOUNTER — Telehealth: Payer: Self-pay

## 2013-10-14 NOTE — Telephone Encounter (Signed)
I have faxed everything on this patient to Dr Jaclyn Prime office [phone]) and have held it out just in case wife calls in the morning saying they didn't receive it.

## 2013-10-14 NOTE — Telephone Encounter (Signed)
Pt's wife called this morning wanting to talk with LSL before they go see Dr. Tressie Stalker. I tried to ask her what she need to ask LSL and she said that it was personal and only wanted to talk with LSL. I also told her that she was seeing patients and it might be awhile before she could call her. Her number is [phone]. Please advise

## 2013-10-14 NOTE — Telephone Encounter (Signed)
noted 

## 2013-10-14 NOTE — Telephone Encounter (Signed)
Spoke with wife. Questions answered.   Please make sure patient's records have been sent and received to Dr. Tressie Stalker. They have appt for 7:30 in morning.   Need our office note, U/S, MRI Abd, liver biopsy resports and labs sent. Thanks!

## 2013-10-16 ENCOUNTER — Telehealth: Payer: Self-pay | Admitting: *Deleted

## 2013-10-16 NOTE — Telephone Encounter (Signed)
Pt's wife called wanting me to send pt's medical records to Dr. Woody Seller and she wanted me to send pt's records from [facility] sent to Dr. Woody Seller, I made pt aware that I can not send pt's records from [place] she would need to call their office and they would send them, pt's wife asked why didn't our office get the records from [place] I made her aware that if we did get those records they are sent to the scanning center but I still could not send those records it will be a hippa violation, pt's wife verbally understood and stated she will call [place]. I called Dr. Woody Seller office and spoke Tammy she stated they did not need any records from [place] and Dr. Woody Seller wanted the oncology report, and Tammy stated she knew I could not send that.

## 2013-10-21 ENCOUNTER — Encounter (INDEPENDENT_AMBULATORY_CARE_PROVIDER_SITE_OTHER): Payer: Medicare Other | Admitting: General Surgery

## 2013-10-21 ENCOUNTER — Other Ambulatory Visit (INDEPENDENT_AMBULATORY_CARE_PROVIDER_SITE_OTHER): Payer: Self-pay | Admitting: General Surgery

## 2013-10-21 DIAGNOSIS — C220 Liver cell carcinoma: Secondary | ICD-10-CM

## 2013-10-22 NOTE — Telephone Encounter (Signed)
Can we request records from Dr. Tressie Stalker in [place] for review. We referred him for [facility].

## 2013-10-22 NOTE — Telephone Encounter (Signed)
I have requested recent records from Dr Jaclyn Prime office

## 2013-10-29 ENCOUNTER — Other Ambulatory Visit: Payer: Self-pay | Admitting: Emergency Medicine

## 2013-10-29 ENCOUNTER — Ambulatory Visit
Admission: RE | Admit: 2013-10-29 | Discharge: 2013-10-29 | Disposition: A | Discharge status: Home / Self Care | Payer: Medicare Other | Source: Ambulatory Visit | Attending: General Surgery | Admitting: General Surgery

## 2013-10-29 DIAGNOSIS — C220 Liver cell carcinoma: Secondary | ICD-10-CM

## 2013-10-29 DIAGNOSIS — C228 Malignant neoplasm of liver, primary, unspecified as to type: Secondary | ICD-10-CM

## 2013-10-29 LAB — CBC
HCT: 37 % — ABNORMAL LOW (ref 39.0–52.0)
Hemoglobin: 12.6 g/dL — ABNORMAL LOW (ref 13.0–17.0)
MCH: 32.1 pg (ref 26.0–34.0)
MCHC: 34.1 g/dL (ref 30.0–36.0)
MCV: 94.4 fL (ref 78.0–100.0)
PLATELETS: 411 10*3/uL — AB (ref 150–400)
RBC: 3.92 MIL/uL — AB (ref 4.22–5.81)
RDW: 15.6 % — ABNORMAL HIGH (ref 11.5–15.5)
WBC: 5.1 10*3/uL (ref 4.0–10.5)

## 2013-10-29 LAB — COMPREHENSIVE METABOLIC PANEL
ALBUMIN: 3.8 g/dL (ref 3.5–5.2)
ALT: 59 U/L — AB (ref 0–53)
AST: 76 U/L — AB (ref 0–37)
Alkaline Phosphatase: 195 U/L — ABNORMAL HIGH (ref 39–117)
BUN: 12 mg/dL (ref 6–23)
CO2: 27 mEq/L (ref 19–32)
Calcium: 9.7 mg/dL (ref 8.4–10.5)
Chloride: 99 mEq/L (ref 96–112)
Creat: 0.65 mg/dL (ref 0.50–1.35)
Glucose, Bld: 93 mg/dL (ref 70–99)
POTASSIUM: 5.1 meq/L (ref 3.5–5.3)
SODIUM: 134 meq/L — AB (ref 135–145)
Total Bilirubin: 0.8 mg/dL (ref 0.2–1.2)
Total Protein: 6.4 g/dL (ref 6.0–8.3)

## 2013-10-29 LAB — PROTIME-INR
INR: 1.38 (ref <1.50)
Prothrombin Time: 17 seconds — ABNORMAL HIGH (ref 11.6–15.2)

## 2013-10-29 NOTE — Consult Note (Signed)
Reason for Consult: Hepatocellular cancer  Chief Complaint: Chief Complaint  Patient presents with  . Advice Only    Consult  for Y-90 SIRT    Referring Physician(s): Byerly,Faera  History of Present Illness:  Caleb Ball is a 78 y.o. male who was found on a routine annual physical to have elevated liver enzymes and a palpable mass in the right upper quadrant.  Subsequent imaging workup unfortunately reveals a morphologically normal liver with a large 16 cm centrally necrotic mass taking up the majority of the right hepatic lobe.  In the absence of overtly cirrhotic morphology or chronic hepatitis-B an ultrasound-guided core biopsy was obtained confirming the diagnosis of hepatocellular carcinoma.    Caleb Ball was subsequently referred to our local hepatobiliary surgeon, Dr. Stark Klein, to discuss potential resection.  According to her clinical notes, given the size of his tumor a resection would entail a right hepatectomy which would leave an insufficient functional liver remnant therefore requiring preoperative portal vein embolization to induce FLR hypertrophy.  This to be a very large operation for a 78 year old individual, even one as highly functional as Mr. Batiz.  The expected recovery time would be 6-12 months.  He was then referred to vascular and interventional Radiology to discuss less invasive liver directed therapies.    Today, Caleb Ball is accompanied by his family.  He currently is asymptomatic.  He denies abdominal pain, nausea or vomiting.  He does have occasional episodes of diarrhea.  Additionally, he has experienced significant weight loss of approximately 30 pounds over the past year.  He denies melena or hematochezia.  He is currently very active then wakes up every morning and walks 1 to 2 miles daily.  He is able to perform all of his activities of daily living.  Currently, he is ECOG 0.     Past Medical History  Diagnosis Date  . Atrial  fibrillation   . Mitral regurgitation   . Dyspnea   . Gout   . HTN (hypertension)     Past Surgical History  Procedure Laterality Date  . Inguinal hernia repair      Left  . Left olecranon bursa resection    . Esophagogastroduodenoscopy  1998    Dr. Gala Romney: [facility], distal esophageal erosions  . Colonoscopy  09/2012    Dr. Anthony Sar: diverticulosis    Allergies: Review of patient's allergies indicates no known allergies.  Medications: Prior to Admission medications   Medication Sig Start Date End Date Taking? Authorizing Provider  allopurinol (ZYLOPRIM) 300 MG tablet Take 450 mg by mouth daily.    Yes Historical Provider, MD  aspirin 81 MG tablet Take 81 mg by mouth daily.   Yes Historical Provider, MD  colchicine 0.6 MG tablet Take 0.6 mg by mouth daily.   Yes Historical Provider, MD  diltiazem (CARDIZEM CD) 300 MG 24 hr capsule Take 300 mg by mouth daily.   Yes Historical Provider, MD  glucosamine-chondroitin 500-400 MG tablet Take 1 tablet by mouth 2 (two) times daily.   Yes Historical Provider, MD  hydroxypropyl methylcellulose (ISOPTO TEARS) 2.5 % ophthalmic solution Place 1 drop into both eyes 3 (three) times daily as needed for dry eyes.   Yes Historical Provider, MD  loperamide (IMODIUM) 2 MG capsule Take 2 mg by mouth as needed for diarrhea or loose stools.   Yes Historical Provider, MD  Omega-3 Fatty Acids (SUPER OMEGA 3 EPA/DHA PO) Take 1 g by mouth 2 (two) times daily.  Yes Historical Provider, MD  ranitidine (ZANTAC) 150 MG tablet Take 150 mg by mouth daily.   Yes Historical Provider, MD  tamsulosin (FLOMAX) 0.4 MG CAPS capsule Take 0.4 mg by mouth daily.  07/28/13  Yes Historical Provider, MD  vitamin B-12 (CYANOCOBALAMIN) 1000 MCG tablet Take 1,000 mcg by mouth daily.   Yes Historical Provider, MD  warfarin (COUMADIN) 5 MG tablet Take 10 mg by mouth daily. 09/10/12  Yes Herminio Commons, MD    Family History  Problem Relation Age of Onset  . Heart attack Mother   .  Heart disease Sister   . Colon cancer Neg Hx     History   Social History  . Marital Status: Married    Spouse Name: N/A    Number of Children: N/A  . Years of Education: N/A   Social History Main Topics  . Smoking status: Never Smoker   . Smokeless tobacco: Never Used  . Alcohol Use: Yes     Comment: To 5 ounce glasses of red wine each night  . Drug Use: No  . Sexual Activity: Not on file   Other Topics Concern  . Not on file   Social History Narrative  . No narrative on file    ECOG Status: 0 - Asymptomatic  Review of Systems: A 12 point ROS discussed and pertinent positives are indicated in the HPI above.  All other systems are negative.  Review of Systems  All other systems reviewed and are negative.   Vital Signs: BP 133/63  Pulse 82  Temp(Src) 97.2 F (36.2 C)  Resp 14  Ht 5\' 8"  (1.727 m)  Wt 168 lb (76.204 kg)  BMI 25.55 kg/m2  SpO2 96%  Physical Exam  Nursing note and vitals reviewed. Constitutional: He is oriented to person, place, and time. He appears well-developed and well-nourished.  HENT:  Head: Normocephalic.  Eyes: No scleral icterus.  Cardiovascular: Normal rate and regular rhythm.   Pulmonary/Chest: Effort normal.  Abdominal: Soft.  Neurological: He is alert and oriented to person, place, and time.  Skin: Skin is warm and dry.  Psychiatric: He has a normal mood and affect. His behavior is normal.    Imaging: US Biopsy  10/07/2013   CLINICAL DATA:  Liver mass  EXAM: ULTRASOUND-GUIDED BIOPSY OF A LIVER MASS.  CORE.  MEDICATIONS AND MEDICAL HISTORY: Versed two mg, Fentanyl 50 mcg.  Additional Medications: None.  ANESTHESIA/SEDATION: Moderate sedation time: 15 minutes  PROCEDURE: The procedure, risks, benefits, and alternatives were explained to the patient. Questions regarding the procedure were encouraged and answered. The patient understands and consents to the procedure.  The right upper quadrant was prepped with Betadine in a sterile  fashion, and a sterile drape was applied covering the operative field. A sterile gown and sterile gloves were used for the procedure.  Under sonographic guidance, an 17gauge guide needle was advanced into the right lobe liver lesion. Subsequently four 18 gauge core biopsies were obtained. Gel-Foam slurry was injected into the tract. Final imaging was performed.  Patient tolerated the procedure well without complication. Vital sign monitoring by nursing staff during the procedure will continue as patient is in the special procedures unit for post procedure observation.  FINDINGS: The images document guide needle placement within the right lobe liver lesion. Post biopsy images demonstrate no hemorrhage.  IMPRESSION: Successful ultrasound-guided core biopsy of a right lobe liver lesion.   Electronically Signed   By: Maryclare Bean M.D.   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  His liver does not appear overtly cirrhotic on imaging an his hepatitis-B serologies are negative.  While he is potentially a resection candidate, the procedure would be a very large procedure for a gentleman of his age and could result in 6-12 months of recovery with associated decreased quality of life.    Liver directed therapies including transarterial chemoembolization and radioembolization (Y90) are not curative therapies but can provide a substantial survival benefit with minimal decrease in quality of life.  I discussed the differences between chemo and radial embolization with the patient and his family at length and was able to answer all of their questions.  At this time, given that his liver function is quite good and that he has a single dominant lesion I  think chemo embolization with drug eluting beads is the best initial therapeutic option.  I have concerns that due to the size of the tumor there will be significant intra tumoral arteriovenous shunting which would prevent or limits use of Y90.  If he tolerates the chemoembolization procedure well, multiple therapies could be performed as needed in an effort to stabilize or decrease his disease burden.  Chemoembolization should also serve to decrease any intra tumoral arteriovenous shunting which would allow for Y90 radioembolization in the future.  If the tumor proves resistant to chemoembolization, radioembolization could then be considered.    Away also briefly discussed palliative chemotherapy with Nexavar.  At this time, the patient would like to reserve further discussion until we have exhausted transarterial liver directed therapy.    Today I will obtain a new set of labs to get an updated picture of his hepatic reserve and we will get him scheduled for an initial chemo embolization procedure with drug eluting beads.  Additionally, I will refer him to Roosevelt Locks, NP of hepatology.  While his liver workup is fairly comprehensive, I would like her to evaluate if there are any other underlying causes for his hepatoma that might warrant testing of his family members.     I spent a total of 45 minutes face to face in clinical consultation, greater than 50% of which was counseling/coordinating care for hepatocellular cancer treatment.  Thank you for this interesting consult.  I greatly enjoyed meeting ALANDO COLLERAN and look forward to participating in their care.  Signed,  Criselda Peaches, MD [facility]

## 2013-10-31 ENCOUNTER — Other Ambulatory Visit (HOSPITAL_COMMUNITY): Payer: Self-pay | Admitting: Interventional Radiology

## 2013-10-31 DIAGNOSIS — C228 Malignant neoplasm of liver, primary, unspecified as to type: Secondary | ICD-10-CM

## 2013-11-05 ENCOUNTER — Other Ambulatory Visit: Payer: Self-pay | Admitting: Interventional Radiology

## 2013-11-05 MED ORDER — DOXORUBICIN HCL 50 MG IV SOLR
150.0000 mg | Freq: Once | INTRAVENOUS | Status: AC
  Administered 2013-11-12: 150 mg via INTRA_ARTERIAL
  Filled 2013-11-05 (×2): qty 150

## 2013-11-06 ENCOUNTER — Encounter (HOSPITAL_COMMUNITY): Payer: Self-pay | Admitting: Pharmacy Technician

## 2013-11-10 ENCOUNTER — Other Ambulatory Visit: Payer: Self-pay | Admitting: Radiology

## 2013-11-11 ENCOUNTER — Other Ambulatory Visit: Payer: Self-pay | Admitting: Radiology

## 2013-11-12 ENCOUNTER — Ambulatory Visit (HOSPITAL_COMMUNITY)
Admission: RE | Admit: 2013-11-12 | Discharge: 2013-11-12 | Disposition: A | Discharge status: Home / Self Care | Payer: Medicare Other | Source: Ambulatory Visit | Attending: Interventional Radiology | Admitting: Interventional Radiology

## 2013-11-12 ENCOUNTER — Encounter (HOSPITAL_COMMUNITY): Payer: Self-pay

## 2013-11-12 ENCOUNTER — Observation Stay (HOSPITAL_COMMUNITY)
Admission: RE | Admit: 2013-11-12 | Discharge: 2013-11-13 | Disposition: A | Discharge status: Home / Self Care | Payer: Medicare Other | Source: Ambulatory Visit | Attending: Interventional Radiology | Admitting: Interventional Radiology

## 2013-11-12 VITALS — BP 145/52 | HR 66 | Temp 98.0°F | Resp 16

## 2013-11-12 DIAGNOSIS — I059 Rheumatic mitral valve disease, unspecified: Secondary | ICD-10-CM | POA: Diagnosis not present

## 2013-11-12 DIAGNOSIS — C228 Malignant neoplasm of liver, primary, unspecified as to type: Principal | ICD-10-CM | POA: Insufficient documentation

## 2013-11-12 DIAGNOSIS — C220 Liver cell carcinoma: Secondary | ICD-10-CM | POA: Diagnosis present

## 2013-11-12 DIAGNOSIS — M109 Gout, unspecified: Secondary | ICD-10-CM | POA: Insufficient documentation

## 2013-11-12 DIAGNOSIS — I4891 Unspecified atrial fibrillation: Secondary | ICD-10-CM | POA: Insufficient documentation

## 2013-11-12 DIAGNOSIS — I10 Essential (primary) hypertension: Secondary | ICD-10-CM | POA: Insufficient documentation

## 2013-11-12 DIAGNOSIS — Z7982 Long term (current) use of aspirin: Secondary | ICD-10-CM | POA: Insufficient documentation

## 2013-11-12 LAB — COMPREHENSIVE METABOLIC PANEL
ALT: 62 U/L — AB (ref 0–53)
ANION GAP: 13 (ref 5–15)
AST: 84 U/L — ABNORMAL HIGH (ref 0–37)
Albumin: 3.4 g/dL — ABNORMAL LOW (ref 3.5–5.2)
Alkaline Phosphatase: 192 U/L — ABNORMAL HIGH (ref 39–117)
BUN: 12 mg/dL (ref 6–23)
CALCIUM: 9.1 mg/dL (ref 8.4–10.5)
CO2: 25 mEq/L (ref 19–32)
Chloride: 99 mEq/L (ref 96–112)
Creatinine, Ser: 0.64 mg/dL (ref 0.50–1.35)
GFR calc non Af Amer: >90 mL/min (ref >90)
GLUCOSE: 110 mg/dL — AB (ref 70–99)
Potassium: 4.2 mEq/L (ref 3.7–5.3)
SODIUM: 137 meq/L (ref 137–147)
TOTAL PROTEIN: 7.2 g/dL (ref 6.0–8.3)
Total Bilirubin: 1 mg/dL (ref 0.3–1.2)

## 2013-11-12 LAB — CBC WITH DIFFERENTIAL/PLATELET
BASOS PCT: 1 % (ref 0–1)
Basophils Absolute: 0 10*3/uL (ref 0.0–0.1)
EOS ABS: 0.1 10*3/uL (ref 0.0–0.7)
EOS PCT: 1 % (ref 0–5)
HCT: 37.5 % — ABNORMAL LOW (ref 39.0–52.0)
Hemoglobin: 12.4 g/dL — ABNORMAL LOW (ref 13.0–17.0)
LYMPHS ABS: 0.6 10*3/uL — AB (ref 0.7–4.0)
Lymphocytes Relative: 13 % (ref 12–46)
MCH: 32 pg (ref 26.0–34.0)
MCHC: 33.1 g/dL (ref 30.0–36.0)
MCV: 96.9 fL (ref 78.0–100.0)
Monocytes Absolute: 0.8 10*3/uL (ref 0.1–1.0)
Monocytes Relative: 16 % — ABNORMAL HIGH (ref 3–12)
Neutro Abs: 3.3 10*3/uL (ref 1.7–7.7)
Neutrophils Relative %: 69 % (ref 43–77)
PLATELETS: 363 10*3/uL (ref 150–400)
RBC: 3.87 MIL/uL — AB (ref 4.22–5.81)
RDW: 15.4 % (ref 11.5–15.5)
WBC: 4.7 10*3/uL (ref 4.0–10.5)

## 2013-11-12 LAB — APTT: aPTT: 34 seconds (ref 24–37)

## 2013-11-12 LAB — PROTIME-INR
INR: 0.99 (ref 0.00–1.49)
Prothrombin Time: 13.1 seconds (ref 11.6–15.2)

## 2013-11-12 MED ORDER — ONDANSETRON HCL 4 MG/2ML IJ SOLN
4.0000 mg | Freq: Four times a day (QID) | INTRAMUSCULAR | Status: DC
  Filled 2013-11-12: qty 2

## 2013-11-12 MED ORDER — ALLOPURINOL 300 MG PO TABS
450.0000 mg | ORAL_TABLET | Freq: Every morning | ORAL | Status: DC
  Administered 2013-11-13: 450 mg via ORAL
  Filled 2013-11-12: qty 1

## 2013-11-12 MED ORDER — PROMETHAZINE HCL 25 MG RE SUPP: 25.0000 mg | Freq: Three times a day (TID) | RECTAL | Status: DC | PRN

## 2013-11-12 MED ORDER — ONDANSETRON HCL 4 MG/2ML IJ SOLN
INTRAMUSCULAR | Status: AC
  Filled 2013-11-12: qty 2

## 2013-11-12 MED ORDER — ONDANSETRON HCL 4 MG/2ML IJ SOLN
INTRAMUSCULAR | Status: AC | PRN
  Administered 2013-11-12: 4 mg via INTRAVENOUS

## 2013-11-12 MED ORDER — SODIUM CHLORIDE 0.9 % IV SOLN
INTRAVENOUS | Status: DC
  Administered 2013-11-12: 08:00:00 via INTRAVENOUS

## 2013-11-12 MED ORDER — OXYCODONE HCL 5 MG PO TABS
5.0000 mg | ORAL_TABLET | ORAL | Status: DC | PRN
  Administered 2013-11-13: 5 mg via ORAL
  Filled 2013-11-12: qty 1

## 2013-11-12 MED ORDER — COLCHICINE 0.6 MG PO TABS
0.6000 mg | ORAL_TABLET | Freq: Every morning | ORAL | Status: DC
  Administered 2013-11-13: 0.6 mg via ORAL
  Filled 2013-11-12: qty 1

## 2013-11-12 MED ORDER — DEXAMETHASONE SODIUM PHOSPHATE 10 MG/ML IJ SOLN: 20.0000 mg | Freq: Once | INTRAMUSCULAR | Status: DC

## 2013-11-12 MED ORDER — DOCUSATE SODIUM 100 MG PO CAPS
100.0000 mg | ORAL_CAPSULE | Freq: Two times a day (BID) | ORAL | Status: DC
  Filled 2013-11-12 (×4): qty 1

## 2013-11-12 MED ORDER — PIPERACILLIN-TAZOBACTAM 3.375 G IVPB
3.3750 g | Freq: Three times a day (TID) | INTRAVENOUS | Status: DC
Start: 2013-11-12 — End: 2013-11-12
  Administered 2013-11-12: 3.375 g via INTRAVENOUS
  Filled 2013-11-12 (×3): qty 50

## 2013-11-12 MED ORDER — DEXAMETHASONE SODIUM PHOSPHATE 10 MG/ML IJ SOLN
INTRAMUSCULAR | Status: AC
  Filled 2013-11-12: qty 1

## 2013-11-12 MED ORDER — FENTANYL CITRATE 0.05 MG/ML IJ SOLN
INTRAMUSCULAR | Status: AC
  Filled 2013-11-12: qty 6

## 2013-11-12 MED ORDER — MIDAZOLAM HCL 2 MG/2ML IJ SOLN
INTRAMUSCULAR | Status: AC
  Filled 2013-11-12: qty 6

## 2013-11-12 MED ORDER — DEXAMETHASONE SODIUM PHOSPHATE 10 MG/ML IJ SOLN
10.0000 mg | Freq: Once | INTRAMUSCULAR | Status: AC
  Administered 2013-11-12: 10 mg via INTRAVENOUS
  Filled 2013-11-12: qty 1

## 2013-11-12 MED ORDER — OXYCODONE HCL 5 MG PO TABS: 10.0000 mg | ORAL_TABLET | ORAL | Status: DC | PRN

## 2013-11-12 MED ORDER — LIDOCAINE HCL 1 % IJ SOLN
INTRAMUSCULAR | Status: AC
  Filled 2013-11-12: qty 20

## 2013-11-12 MED ORDER — ONDANSETRON 8 MG/NS 50 ML IVPB
8.0000 mg | Freq: Four times a day (QID) | INTRAVENOUS | Status: DC | PRN
  Filled 2013-11-12: qty 8

## 2013-11-12 MED ORDER — FENTANYL CITRATE 0.05 MG/ML IJ SOLN
INTRAMUSCULAR | Status: AC | PRN
  Administered 2013-11-12 (×3): 50 ug via INTRAVENOUS

## 2013-11-12 MED ORDER — ASPIRIN 81 MG PO TABS
81.0000 mg | ORAL_TABLET | Freq: Every morning | ORAL | Status: DC
  Administered 2013-11-13: 81 mg via ORAL
  Filled 2013-11-12: qty 1

## 2013-11-12 MED ORDER — LOPERAMIDE HCL 2 MG PO CAPS: 2.0000 mg | ORAL_CAPSULE | ORAL | Status: DC | PRN

## 2013-11-12 MED ORDER — SODIUM CHLORIDE 0.9 % IJ SOLN: 3.0000 mL | INTRAMUSCULAR | Status: DC | PRN

## 2013-11-12 MED ORDER — PROMETHAZINE HCL 25 MG PO TABS: 25.0000 mg | ORAL_TABLET | Freq: Three times a day (TID) | ORAL | Status: DC | PRN

## 2013-11-12 MED ORDER — ENOXAPARIN SODIUM 40 MG/0.4ML ~~LOC~~ SOLN
40.0000 mg | SUBCUTANEOUS | Status: DC
  Administered 2013-11-12: 40 mg via SUBCUTANEOUS
  Filled 2013-11-12 (×2): qty 0.4

## 2013-11-12 MED ORDER — SODIUM CHLORIDE 0.9 % IJ SOLN: 3.0000 mL | Freq: Two times a day (BID) | INTRAMUSCULAR | Status: DC

## 2013-11-12 MED ORDER — ACETAMINOPHEN 500 MG PO TABS: 500.0000 mg | ORAL_TABLET | Freq: Once | ORAL | Status: AC | PRN

## 2013-11-12 MED ORDER — DILTIAZEM HCL ER COATED BEADS 300 MG PO CP24
300.0000 mg | ORAL_CAPSULE | Freq: Every morning | ORAL | Status: DC
  Administered 2013-11-13: 300 mg via ORAL
  Filled 2013-11-12: qty 1

## 2013-11-12 MED ORDER — MIDAZOLAM HCL 2 MG/2ML IJ SOLN
INTRAMUSCULAR | Status: AC | PRN
  Administered 2013-11-12 (×4): 1 mg via INTRAVENOUS

## 2013-11-12 MED ORDER — SODIUM CHLORIDE 0.9 % IV SOLN: 250.0000 mL | INTRAVENOUS | Status: DC | PRN

## 2013-11-12 MED ORDER — SODIUM CHLORIDE 0.9 % IV SOLN
INTRAVENOUS | Status: AC
  Administered 2013-11-12: 1000 mL via INTRAVENOUS

## 2013-11-12 MED ORDER — PANTOPRAZOLE SODIUM 40 MG IV SOLR
40.0000 mg | INTRAVENOUS | Status: DC
  Administered 2013-11-12: 40 mg via INTRAVENOUS
  Filled 2013-11-12 (×2): qty 40

## 2013-11-12 MED ORDER — PIPERACILLIN-TAZOBACTAM 3.375 G IVPB: 3.3750 g | Freq: Three times a day (TID) | INTRAVENOUS | Status: DC

## 2013-11-12 NOTE — H&P (Signed)
Chief Complaint: "I'm here for my liver treatment" Referring Physician:Dr. Stark Klein HPI: Caleb Ball is an 78 y.o. male recently diagnosed with [place] of the right lobe. He was seen by Dr. Laurence Ferrari in consult for consideration of radio vs chemoembolization. See full consult note from 10/29/13 He is now scheduled for  DEB-TACE procedure PMHx, meds, labs reviewed. Pt stopped Coumadin 5 days ago and has been NPO this am He feels pretty good otherwise  Past Medical History:  Past Medical History  Diagnosis Date  . Atrial fibrillation   . Mitral regurgitation   . Dyspnea   . Gout   . HTN (hypertension)     Past Surgical History:  Past Surgical History  Procedure Laterality Date  . Inguinal hernia repair      Left  . Left olecranon bursa resection    . Esophagogastroduodenoscopy  1998    Dr. Gala Romney: [facility], distal esophageal erosions  . Colonoscopy  09/2012    Dr. Anthony Sar: diverticulosis    Family History:  Family History  Problem Relation Age of Onset  . Heart attack Mother   . Heart disease Sister   . Colon cancer Neg Hx     Social History:  reports that he has never smoked. He has never used smokeless tobacco. He reports that he drinks alcohol. He reports that he does not use illicit drugs.  Allergies: No Known Allergies  Medications:   Medication List    ASK your doctor about these medications       acetaminophen 500 MG tablet  Commonly known as:  TYLENOL  Take 500 mg by mouth once as needed for mild pain.     allopurinol 300 MG tablet  Commonly known as:  ZYLOPRIM  Take 450 mg by mouth every morning.     aspirin 81 MG tablet  Take 81 mg by mouth every morning.     colchicine 0.6 MG tablet  Take 0.6 mg by mouth every morning.     diltiazem 300 MG 24 hr capsule  Commonly known as:  CARDIZEM CD  Take 300 mg by mouth every morning.     glucosamine-chondroitin 500-400 MG tablet  Take 1 tablet by mouth 2 (two) times daily.     hydroxypropyl  methylcellulose / hypromellose 2.5 % ophthalmic solution  Commonly known as:  ISOPTO TEARS / GONIOVISC  Place 1 drop into both eyes 3 (three) times daily as needed for dry eyes.     loperamide 2 MG capsule  Commonly known as:  IMODIUM  Take 2 mg by mouth as needed for diarrhea or loose stools.     ranitidine 150 MG tablet  Commonly known as:  ZANTAC  Take 150 mg by mouth every morning.     SUPER OMEGA 3 EPA/DHA PO  Take 1 g by mouth 2 (two) times daily.     tamsulosin 0.4 MG Caps capsule  Commonly known as:  FLOMAX  Take 0.4 mg by mouth every morning.     vitamin B-12 1000 MCG tablet  Commonly known as:  CYANOCOBALAMIN  Take 1,000 mcg by mouth every morning.     warfarin 5 MG tablet  Commonly known as:  COUMADIN  Take 10 mg by mouth every evening. @ 5 pm.        Please HPI for pertinent positives, otherwise complete 10 system ROS negative.  Physical Exam: BP 136/58  Pulse 85  Temp(Src) 98.2 F (36.8 C) (Oral)  Resp 16  SpO2 98% There is no  weight on file to calculate BMI.   General Appearance:  Alert, cooperative, no distress, appears stated age  Head:  Normocephalic, without obvious abnormality, atraumatic  ENT: Unremarkable  Neck: Supple, symmetrical, trachea midline  Lungs:   Clear to auscultation bilaterally, no w/r/r, respirations unlabored without use of accessory muscles.  Heart:  Regular rate and rhythm, S1, S2 normal, no murmur, rub or gallop.  Abdomen:   Soft, non-tender, non distended.  Extremities: Extremities normal, atraumatic, no cyanosis or edema  Pulses: 2+ and symmetric femoral  Neurologic: Normal affect, no gross deficits.  Labs: Results for orders placed during the hospital encounter of 11/12/13 (from the past 48 hour(s))  APTT     Status: None   Collection Time    11/12/13  8:10 AM      Result Value Ref Range   aPTT 34  24 - 37 seconds  CBC WITH DIFFERENTIAL     Status: Abnormal   Collection Time    11/12/13  8:10 AM      Result Value  Ref Range   WBC 4.7  4.0 - 10.5 K/uL   RBC 3.87 (*) 4.22 - 5.81 MIL/uL   Hemoglobin 12.4 (*) 13.0 - 17.0 g/dL   HCT 37.5 (*) 39.0 - 52.0 %   MCV 96.9  78.0 - 100.0 fL   MCH 32.0  26.0 - 34.0 pg   MCHC 33.1  30.0 - 36.0 g/dL   RDW 15.4  11.5 - 15.5 %   Platelets 363  150 - 400 K/uL   Neutrophils Relative % 69  43 - 77 %   Neutro Abs 3.3  1.7 - 7.7 K/uL   Lymphocytes Relative 13  12 - 46 %   Lymphs Abs 0.6 (*) 0.7 - 4.0 K/uL   Monocytes Relative 16 (*) 3 - 12 %   Monocytes Absolute 0.8  0.1 - 1.0 K/uL   Eosinophils Relative 1  0 - 5 %   Eosinophils Absolute 0.1  0.0 - 0.7 K/uL   Basophils Relative 1  0 - 1 %   Basophils Absolute 0.0  0.0 - 0.1 K/uL  COMPREHENSIVE METABOLIC PANEL     Status: Abnormal   Collection Time    11/12/13  8:10 AM      Result Value Ref Range   Sodium 137  137 - 147 mEq/L   Potassium 4.2  3.7 - 5.3 mEq/L   Chloride 99  96 - 112 mEq/L   CO2 25  19 - 32 mEq/L   Glucose, Bld 110 (*) 70 - 99 mg/dL   BUN 12  6 - 23 mg/dL   Creatinine, Ser 0.64  0.50 - 1.35 mg/dL   Calcium 9.1  8.4 - 10.5 mg/dL   Total Protein 7.2  6.0 - 8.3 g/dL   Albumin 3.4 (*) 3.5 - 5.2 g/dL   AST 84 (*) 0 - 37 U/L   ALT 62 (*) 0 - 53 U/L   Alkaline Phosphatase 192 (*) 39 - 117 U/L   Total Bilirubin 1.0  0.3 - 1.2 mg/dL   GFR calc non Af Amer >90  >90 mL/min   GFR calc Af Amer >90  >90 mL/min   Comment: (NOTE)     The eGFR has been calculated using the CKD EPI equation.     This calculation has not been validated in all clinical situations.     eGFR's persistently <90 mL/min signify possible Chronic Kidney     Disease.   Anion gap 13  5 - 15  PROTIME-INR     Status: None   Collection Time    11/12/13  8:10 AM      Result Value Ref Range   Prothrombin Time 13.1  11.6 - 15.2 seconds   INR 0.99  0.00 - 1.49    Imaging: No results found.  Assessment/Plan HCC of right lobe For DEB-TACE procedure today Reviewed procedure, risks, complications, use of sedation and plan for  overnight admission for observation. Labs reviewed, ok Consent signed in chart  Ascencion Dike PA-C 11/12/2013, 9:00 AM    I saw and evaluated Mr. Caleb Ball and agree with the PA note above.  Pt's family is present, we discussed the risks, benefits and alternatives again in detail and all questions were answered.  Mr. Ekholm understands and wishes to proceed.    Signed,  Criselda Peaches, MD

## 2013-11-12 NOTE — Telephone Encounter (Signed)
I still have not seen any records from Dr. Jaclyn Prime in [place].

## 2013-11-12 NOTE — H&P (Signed)
Chief Complaint: "I'm here for my liver treatment" Referring Physician:Dr. Stark Klein HPI: Caleb Ball is an 78 y.o. male recently diagnosed with [place] of the right lobe. He was seen by Dr. Laurence Ferrari in consult for consideration of radio vs chemoembolization. See full consult note from 10/29/13 He is now scheduled for  DEB-TACE procedure PMHx, meds, labs reviewed. Pt stopped Coumadin 5 days ago and has been NPO this am He feels pretty good otherwise  Past Medical History:  Past Medical History  Diagnosis Date  . Atrial fibrillation   . Mitral regurgitation   . Dyspnea   . Gout   . HTN (hypertension)     Past Surgical History:  Past Surgical History  Procedure Laterality Date  . Inguinal hernia repair      Left  . Left olecranon bursa resection    . Esophagogastroduodenoscopy  1998    Dr. Gala Romney: [facility], distal esophageal erosions  . Colonoscopy  09/2012    Dr. Anthony Sar: diverticulosis    Family History:  Family History  Problem Relation Age of Onset  . Heart attack Mother   . Heart disease Sister   . Colon cancer Neg Hx     Social History:  reports that he has never smoked. He has never used smokeless tobacco. He reports that he drinks alcohol. He reports that he does not use illicit drugs.  Allergies: No Known Allergies  Medications:   Medication List    ASK your doctor about these medications       acetaminophen 500 MG tablet  Commonly known as:  TYLENOL  Take 500 mg by mouth once as needed for mild pain.     allopurinol 300 MG tablet  Commonly known as:  ZYLOPRIM  Take 450 mg by mouth every morning.     aspirin 81 MG tablet  Take 81 mg by mouth every morning.     colchicine 0.6 MG tablet  Take 0.6 mg by mouth every morning.     diltiazem 300 MG 24 hr capsule  Commonly known as:  CARDIZEM CD  Take 300 mg by mouth every morning.     glucosamine-chondroitin 500-400 MG tablet  Take 1 tablet by mouth 2 (two) times daily.     hydroxypropyl  methylcellulose / hypromellose 2.5 % ophthalmic solution  Commonly known as:  ISOPTO TEARS / GONIOVISC  Place 1 drop into both eyes 3 (three) times daily as needed for dry eyes.     loperamide 2 MG capsule  Commonly known as:  IMODIUM  Take 2 mg by mouth as needed for diarrhea or loose stools.     ranitidine 150 MG tablet  Commonly known as:  ZANTAC  Take 150 mg by mouth every morning.     SUPER OMEGA 3 EPA/DHA PO  Take 1 g by mouth 2 (two) times daily.     tamsulosin 0.4 MG Caps capsule  Commonly known as:  FLOMAX  Take 0.4 mg by mouth every morning.     vitamin B-12 1000 MCG tablet  Commonly known as:  CYANOCOBALAMIN  Take 1,000 mcg by mouth every morning.     warfarin 5 MG tablet  Commonly known as:  COUMADIN  Take 10 mg by mouth every evening. @ 5 pm.        Please HPI for pertinent positives, otherwise complete 10 system ROS negative.  Physical Exam: BP 136/58  Pulse 85  Temp(Src) 98.2 F (36.8 C) (Oral)  Resp 16  SpO2 98% There is no  weight on file to calculate BMI.   General Appearance:  Alert, cooperative, no distress, appears stated age  Head:  Normocephalic, without obvious abnormality, atraumatic  ENT: Unremarkable  Neck: Supple, symmetrical, trachea midline  Lungs:   Clear to auscultation bilaterally, no w/r/r, respirations unlabored without use of accessory muscles.  Heart:  Regular rate and rhythm, S1, S2 normal, no murmur, rub or gallop.  Abdomen:   Soft, non-tender, non distended.  Extremities: Extremities normal, atraumatic, no cyanosis or edema  Pulses: 2+ and symmetric femoral  Neurologic: Normal affect, no gross deficits.  Labs: Results for orders placed during the hospital encounter of 11/12/13 (from the past 48 hour(s))  APTT     Status: None   Collection Time    11/12/13  8:10 AM      Result Value Ref Range   aPTT 34  24 - 37 seconds  CBC WITH DIFFERENTIAL     Status: Abnormal   Collection Time    11/12/13  8:10 AM      Result Value  Ref Range   WBC 4.7  4.0 - 10.5 K/uL   RBC 3.87 (*) 4.22 - 5.81 MIL/uL   Hemoglobin 12.4 (*) 13.0 - 17.0 g/dL   HCT 37.5 (*) 39.0 - 52.0 %   MCV 96.9  78.0 - 100.0 fL   MCH 32.0  26.0 - 34.0 pg   MCHC 33.1  30.0 - 36.0 g/dL   RDW 15.4  11.5 - 15.5 %   Platelets 363  150 - 400 K/uL   Neutrophils Relative % 69  43 - 77 %   Neutro Abs 3.3  1.7 - 7.7 K/uL   Lymphocytes Relative 13  12 - 46 %   Lymphs Abs 0.6 (*) 0.7 - 4.0 K/uL   Monocytes Relative 16 (*) 3 - 12 %   Monocytes Absolute 0.8  0.1 - 1.0 K/uL   Eosinophils Relative 1  0 - 5 %   Eosinophils Absolute 0.1  0.0 - 0.7 K/uL   Basophils Relative 1  0 - 1 %   Basophils Absolute 0.0  0.0 - 0.1 K/uL  COMPREHENSIVE METABOLIC PANEL     Status: Abnormal   Collection Time    11/12/13  8:10 AM      Result Value Ref Range   Sodium 137  137 - 147 mEq/L   Potassium 4.2  3.7 - 5.3 mEq/L   Chloride 99  96 - 112 mEq/L   CO2 25  19 - 32 mEq/L   Glucose, Bld 110 (*) 70 - 99 mg/dL   BUN 12  6 - 23 mg/dL   Creatinine, Ser 0.64  0.50 - 1.35 mg/dL   Calcium 9.1  8.4 - 10.5 mg/dL   Total Protein 7.2  6.0 - 8.3 g/dL   Albumin 3.4 (*) 3.5 - 5.2 g/dL   AST 84 (*) 0 - 37 U/L   ALT 62 (*) 0 - 53 U/L   Alkaline Phosphatase 192 (*) 39 - 117 U/L   Total Bilirubin 1.0  0.3 - 1.2 mg/dL   GFR calc non Af Amer >90  >90 mL/min   GFR calc Af Amer >90  >90 mL/min   Comment: (NOTE)     The eGFR has been calculated using the CKD EPI equation.     This calculation has not been validated in all clinical situations.     eGFR's persistently <90 mL/min signify possible Chronic Kidney     Disease.   Anion gap 13  5 - 15  PROTIME-INR     Status: None   Collection Time    11/12/13  8:10 AM      Result Value Ref Range   Prothrombin Time 13.1  11.6 - 15.2 seconds   INR 0.99  0.00 - 1.49    Imaging: No results found.  Assessment/Plan HCC of right lobe For DEB-TACE procedure today Reviewed procedure, risks, complications, use of sedation and plan for  overnight admission for observation. Labs reviewed, ok Consent signed in chart  Ascencion Dike PA-C 11/12/2013, 9:00 AM

## 2013-11-12 NOTE — Procedures (Signed)
Interventional Radiology Procedure Note  Procedure: Transarterial chemoembolization with drug-eluting beads.  A total of 150 mg doxirubicin absorbed onto 100-300 um beads was administered.  Dose divided into right phrenic and right hepatic arteries.  Access: Right CFA Closure Device: Cordis ExoSeal  Complications: None immediate Recommendations: - Bedrest x 4 hrs - ADAT - IV hydration and abx - Pain and nausea control - Labs in am - D/C home on Cipro x 5 days  Signed,  Criselda Peaches, MD

## 2013-11-12 NOTE — Telephone Encounter (Signed)
I requested on 9/2 and again today.

## 2013-11-13 ENCOUNTER — Other Ambulatory Visit: Payer: Self-pay | Admitting: Interventional Radiology

## 2013-11-13 ENCOUNTER — Other Ambulatory Visit: Payer: Self-pay | Admitting: Emergency Medicine

## 2013-11-13 DIAGNOSIS — C220 Liver cell carcinoma: Secondary | ICD-10-CM

## 2013-11-13 DIAGNOSIS — C228 Malignant neoplasm of liver, primary, unspecified as to type: Secondary | ICD-10-CM | POA: Diagnosis not present

## 2013-11-13 LAB — COMPREHENSIVE METABOLIC PANEL
ALBUMIN: 2.8 g/dL — AB (ref 3.5–5.2)
ALK PHOS: 154 U/L — AB (ref 39–117)
ALT: 126 U/L — ABNORMAL HIGH (ref 0–53)
ANION GAP: 10 (ref 5–15)
AST: 278 U/L — AB (ref 0–37)
BUN: 13 mg/dL (ref 6–23)
CHLORIDE: 102 meq/L (ref 96–112)
CO2: 24 mEq/L (ref 19–32)
Calcium: 8.6 mg/dL (ref 8.4–10.5)
Creatinine, Ser: 0.73 mg/dL (ref 0.50–1.35)
GFR calc Af Amer: >90 mL/min (ref >90)
GFR calc non Af Amer: 88 mL/min — ABNORMAL LOW (ref >90)
Glucose, Bld: 122 mg/dL — ABNORMAL HIGH (ref 70–99)
POTASSIUM: 4.8 meq/L (ref 3.7–5.3)
Sodium: 136 mEq/L — ABNORMAL LOW (ref 137–147)
TOTAL PROTEIN: 6.3 g/dL (ref 6.0–8.3)
Total Bilirubin: 0.7 mg/dL (ref 0.3–1.2)

## 2013-11-13 LAB — CBC
HEMATOCRIT: 33.9 % — AB (ref 39.0–52.0)
HEMOGLOBIN: 11.3 g/dL — AB (ref 13.0–17.0)
MCH: 31.9 pg (ref 26.0–34.0)
MCHC: 33.3 g/dL (ref 30.0–36.0)
MCV: 95.8 fL (ref 78.0–100.0)
Platelets: 312 10*3/uL (ref 150–400)
RBC: 3.54 MIL/uL — ABNORMAL LOW (ref 4.22–5.81)
RDW: 15.3 % (ref 11.5–15.5)
WBC: 7.4 10*3/uL (ref 4.0–10.5)

## 2013-11-13 MED FILL — Medication: Qty: 1 | Status: AC

## 2013-11-13 NOTE — Discharge Summary (Signed)
Physician Discharge Summary  Patient ID: Caleb Ball MRN: [id number] DOB/AGE: [id number] 76 y.o.  Admit date: 11/12/2013 Discharge date: 11/13/2013  Admission Diagnoses: Hepatocellular carcinoma  Discharge Diagnoses: Treatment of [place]  Active Problems: Hepatocellular carcinoma Atrial fib HTN Mitral valve disorder  Discharged Condition: stable  Hospital Course: Pt with diagnosis Rt lobe hepatocellular carcinoma. Treatment procedure of Transarterial chemoembolization with drug eluding beads was performed in IR with Dr Laurence Ferrari 11/12/13.  Pt tolerated well. Overnight stay was uneventful. He has slept well and eating well. Denies nausea or vomiting. Denies pain or any other issue.   I have informed./discussed with Dr Laurence Ferrari pt status. I have seen and examined pt. Plan for dc to home now. Resume all home meds; may restart coumadin today.  Consults: None  Significant Diagnostic Studies: Hepatic arteriogram  Treatments: Transarterial chemoembolization with drug eluding beads - Rt lobe hepatocellular carcinoma  Discharge Exam: Blood pressure 145/52, pulse 66, temperature 98 F (36.7 C), temperature source Oral, resp. rate 16, SpO2 98.00%.  PE: A/O; pleasant Heart: RRR Lungs: CTA Abd: soft; +BS; NT Extr: FROM Rt groin: NT; no bleeding; no hematoma Rt foot: 2+ pulses UOP: good; yellow/clear Has ambulated in hall w/o incident  Disposition: Rt lobe hepatocellular carcinoma transarterial chemoembolization with drug eluding beads performed in IR with Dr Laurence Ferrari 11/12/13. Resume all home meds Rx: Cipro 500 mg BID #10        Zofran 4mg  #20 Follow up with Dr McCullough---clinic will call pt with time and date Will need labs drawn next week Pt and wife have good understanding of discharge instructions and plan   Discharge Instructions   Call MD for:  difficulty breathing, headache or visual disturbances    Complete by:  As directed      Call MD for:  extreme fatigue     Complete by:  As directed      Call MD for:  hives    Complete by:  As directed      Call MD for:  persistant dizziness or light-headedness    Complete by:  As directed      Call MD for:  persistant nausea and vomiting    Complete by:  As directed      Call MD for:  redness, tenderness, or signs of infection (pain, swelling, redness, odor or green/yellow discharge around incision site)    Complete by:  As directed      Call MD for:  severe uncontrolled pain    Complete by:  As directed      Call MD for:  temperature >100.4    Complete by:  As directed      Diet - low sodium heart healthy    Complete by:  As directed      Discharge instructions    Complete by:  As directed   Restful x few days; follow up with Dr Laurence Ferrari at clinic; clinic will call you with time and date; resume all home meds     Discharge wound care:    Complete by:  As directed   May shower today; replace Rt groin bandage with clean band aid today after shower; replace band aid daily x 5 -7 days     Driving Restrictions    Complete by:  As directed   No driving x 1 week     Increase activity slowly    Complete by:  As directed      Lifting restrictions    Complete by:  As directed  No lifting over 10 lbs x 1 week            Medication List         acetaminophen 500 MG tablet  Commonly known as:  TYLENOL  Take 500 mg by mouth once as needed for mild pain.     allopurinol 300 MG tablet  Commonly known as:  ZYLOPRIM  Take 450 mg by mouth every morning.     aspirin 81 MG tablet  Take 81 mg by mouth every morning.     colchicine 0.6 MG tablet  Take 0.6 mg by mouth every morning.     diltiazem 300 MG 24 hr capsule  Commonly known as:  CARDIZEM CD  Take 300 mg by mouth every morning.     glucosamine-chondroitin 500-400 MG tablet  Take 1 tablet by mouth 2 (two) times daily.     hydroxypropyl methylcellulose / hypromellose 2.5 % ophthalmic solution  Commonly known as:  ISOPTO TEARS / GONIOVISC   Place 1 drop into both eyes 3 (three) times daily as needed for dry eyes.     loperamide 2 MG capsule  Commonly known as:  IMODIUM  Take 2 mg by mouth as needed for diarrhea or loose stools.     ranitidine 150 MG tablet  Commonly known as:  ZANTAC  Take 150 mg by mouth every morning.     SUPER OMEGA 3 EPA/DHA PO  Take 1 g by mouth 2 (two) times daily.     tamsulosin 0.4 MG Caps capsule  Commonly known as:  FLOMAX  Take 0.4 mg by mouth every morning.     vitamin B-12 1000 MCG tablet  Commonly known as:  CYANOCOBALAMIN  Take 1,000 mcg by mouth every morning.     warfarin 5 MG tablet  Commonly known as:  COUMADIN  Take 10 mg by mouth every evening. @ 5 pm.         Signed: Santhiago Collingsworth A 11/13/2013, 11:27 AM

## 2013-11-13 NOTE — Progress Notes (Signed)
Patient discharged to home via wheelchair with wife, discharge instructions reviewed with patient who verbalized understanding. New RX given to patient by PA.

## 2013-11-24 ENCOUNTER — Other Ambulatory Visit: Payer: Self-pay | Admitting: Emergency Medicine

## 2013-11-24 DIAGNOSIS — C220 Liver cell carcinoma: Secondary | ICD-10-CM

## 2013-11-28 ENCOUNTER — Telehealth: Payer: Self-pay | Admitting: Emergency Medicine

## 2013-11-28 NOTE — Telephone Encounter (Signed)
WIFE STATED THAT PT HAS HAD BILAT ANKLE SWELLING FOR THE PAST COUPLE OF DAYS AND IS WORSE AFTER WALKING.  HE DOES TRY TO ELEVATE HIS LEGS.   CALLED DR HM TO MAKE HIM AWARE AND HE SUGGESTED THAT PT CONTACT THEIR PRIMARY CARE PHYSICIAN.  CALLED WIFE TO LET HER KNOW.

## 2013-12-02 NOTE — Telephone Encounter (Signed)
Records reviewed.  Patient underwent drug eluting bead transarterial chemoembolization on 11/12/13.

## 2013-12-03 ENCOUNTER — Other Ambulatory Visit (HOSPITAL_COMMUNITY): Payer: Self-pay | Admitting: Interventional Radiology

## 2013-12-03 ENCOUNTER — Other Ambulatory Visit: Payer: Self-pay | Admitting: Radiology

## 2013-12-03 DIAGNOSIS — C220 Liver cell carcinoma: Secondary | ICD-10-CM

## 2013-12-24 ENCOUNTER — Ambulatory Visit
Admission: RE | Admit: 2013-12-24 | Discharge: 2013-12-24 | Disposition: A | Discharge status: Home / Self Care | Payer: Medicare Other | Source: Ambulatory Visit | Attending: Interventional Radiology | Admitting: Interventional Radiology

## 2013-12-24 DIAGNOSIS — C220 Liver cell carcinoma: Secondary | ICD-10-CM

## 2013-12-29 NOTE — Progress Notes (Signed)
Chief Complaint: Scheduled fllow-up 1 month status post chemoembolization with drug-eluting beads  Referring Physician(s): Laine Fonner  History of Present Illness: Caleb Ball is a 78 y.o. male with incidentally discovered massive hepatocellular of the right lobe of the liver.  His initial tumor was 16-18 cm in diameter.  He underwent transarterial chemotherapy with drug eluting beads on 11/12/13 and received a full dose of 150 mg Doxorubicin.  He did very well post procedure and presents today for his follow-up evaluation accompanied by his wife.   He is currently asymptomatic.  Specifically, he denies abdominal pain, distension, nausea or vomiting.  In fact, his appetite is excellent.  He does note mild fatigue.  No fevers, chills, night sweats, chest pains or SOB.  Unfortunately, the results of his recent MRI are not good.  While we did have some effect within the central aspect of the main tumor, the lesion overall continues to expand and there is new disease in the left liver as well as new extrahepatic disease along the spinous processes.   Past Medical History  Diagnosis Date  . Atrial fibrillation   . Mitral regurgitation   . Dyspnea   . Gout   . HTN (hypertension)     Past Surgical History  Procedure Laterality Date  . Inguinal hernia repair      Left  . Left olecranon bursa resection    . Esophagogastroduodenoscopy  1998    Dr. Gala Romney: [facility], distal esophageal erosions  . Colonoscopy  09/2012    Dr. Anthony Sar: diverticulosis    Allergies: Review of patient's allergies indicates no known allergies.  Medications: Prior to Admission medications   Medication Sig Start Date End Date Taking? Authorizing Provider  acetaminophen (TYLENOL) 500 MG tablet Take 500 mg by mouth once as needed for mild pain.    Historical Provider, MD  allopurinol (ZYLOPRIM) 300 MG tablet Take 450 mg by mouth every morning.     Historical Provider, MD  aspirin 81 MG tablet Take 81 mg by  mouth every morning.     Historical Provider, MD  colchicine 0.6 MG tablet Take 0.6 mg by mouth every morning.     Historical Provider, MD  diltiazem (CARDIZEM CD) 300 MG 24 hr capsule Take 300 mg by mouth every morning.     Historical Provider, MD  glucosamine-chondroitin 500-400 MG tablet Take 1 tablet by mouth 2 (two) times daily.    Historical Provider, MD  hydroxypropyl methylcellulose (ISOPTO TEARS) 2.5 % ophthalmic solution Place 1 drop into both eyes 3 (three) times daily as needed for dry eyes.    Historical Provider, MD  loperamide (IMODIUM) 2 MG capsule Take 2 mg by mouth as needed for diarrhea or loose stools.    Historical Provider, MD  Omega-3 Fatty Acids (SUPER OMEGA 3 EPA/DHA PO) Take 1 g by mouth 2 (two) times daily.     Historical Provider, MD  ranitidine (ZANTAC) 150 MG tablet Take 150 mg by mouth every morning.     Historical Provider, MD  tamsulosin (FLOMAX) 0.4 MG CAPS capsule Take 0.4 mg by mouth every morning.  07/28/13   Historical Provider, MD  vitamin B-12 (CYANOCOBALAMIN) 1000 MCG tablet Take 1,000 mcg by mouth every morning.     Historical Provider, MD  warfarin (COUMADIN) 5 MG tablet Take 10 mg by mouth every evening. @ 5 pm. 09/10/12   Herminio Commons, MD    Family History  Problem Relation Age of Onset  . Heart attack Mother   .  Heart disease Sister   . Colon cancer Neg Hx     History   Social History  . Marital Status: Married    Spouse Name: N/A    Number of Children: N/A  . Years of Education: N/A   Social History Main Topics  . Smoking status: Never Smoker   . Smokeless tobacco: Never Used  . Alcohol Use: Yes     Comment: To 5 ounce glasses of red wine each night  . Drug Use: No  . Sexual Activity: Not on file   Other Topics Concern  . Not on file   Social History Narrative  . No narrative on file    ECOG Status: 1 - Symptomatic but completely ambulatory  Review of Systems: A 12 point ROS discussed and pertinent positives are  indicated in the HPI above.  All other systems are negative.  Review of Systems  Vital Signs: BP 142/69 mmHg  Pulse 80  Temp(Src) 97.8 F (36.6 C)  Resp 16  SpO2 98%  Physical Exam  Constitutional: He is oriented to person, place, and time.  HENT:  Head: Normocephalic and atraumatic.  Eyes: No scleral icterus.  Cardiovascular: Normal rate.   Pulmonary/Chest: Effort normal.  Abdominal: Soft. There is no tenderness.  Palpable liver mass below right costal margin  Neurological: He is alert and oriented to person, place, and time.  Skin: Skin is dry.  Psychiatric: He has a normal mood and affect. His behavior is normal.  Nursing note and vitals reviewed.   Imaging: No results found.  Labs:  CBC:  Recent Labs  10/07/13 1208 10/29/13 0954 11/12/13 0810 11/13/13 0433  WBC 5.7 5.1 4.7 7.4  HGB 12.0* 12.6* 12.4* 11.3*  HCT 35.4* 37.0* 37.5* 33.9*  PLT 417* 411* 363 312    COAGS:  Recent Labs  10/07/13 1208 10/29/13 0954 11/12/13 0810  INR 1.01 1.38 0.99  APTT 33  --  34    BMP:  Recent Labs  10/29/13 0954 11/12/13 0810 11/13/13 0433  NA 134* 137 136*  K 5.1 4.2 4.8  CL 99 99 102  CO2 27 25 24   GLUCOSE 93 110* 122*  BUN 12 12 13   CALCIUM 9.7 9.1 8.6  CREATININE 0.65 0.64 0.73  GFRNONAA  --  >90 88*  GFRAA  --  >90 >90    LIVER FUNCTION TESTS:  Recent Labs  10/29/13 0954 11/12/13 0810 11/13/13 0433  BILITOT 0.8 1.0 0.7  AST 76* 84* 278*  ALT 59* 62* 126*  ALKPHOS 195* 192* 154*  PROT 6.4 7.2 6.3  ALBUMIN 3.8 3.4* 2.8*    TUMOR MARKERS: No results for input(s): AFPTM, CEA, CA199, CHROMGRNA in the last 8760 hours.  Assessment and Plan:  Unfortunately, this extremely pleasant gentleman's disease continues to progress despite recent DEB-TACE.  While there has been some interval decrease in enhancment centrally within his dominant mass, the lesion overall has enlarged.  Additionally, there are new lesions in the left lobe of the liver as  well as evidence of extra-hepatic disease on his most recent MRI.  Further liver-directed therapy is unlikely to be of benefit.  We discussed palliative chemotherapy with Nexavar (sorafenib) to treat both his hepatic and extra-hepatic disease.  He and his wife understand the results of this most recent MRI and their options moving forward.  I spoke with his oncologist, Dr. Everardo All, who agrees with this plan and will arrange to see Mr. Ang to discuss Nexavar.     Signed,  Criselda Peaches, MD    I spent a total of 30 minutes face to face in clinical consultation, greater than 50% of which was counseling/coordinating care for hepatocellular cancer.  SignedLaurence Ferrari, [place] 12/29/2013, 6:27 AM

## 2014-05-22 DEATH — deceased

## 2015-02-02 IMAGING — US US BIOPSY
1 series · 11 of 11 positions shown · non-contrast
Comparison: none

CLINICAL DATA: Liver mass

[Series 1: us biopsy · 0.27mm/px · 11 of 11 slices shown]
[im 1/11]
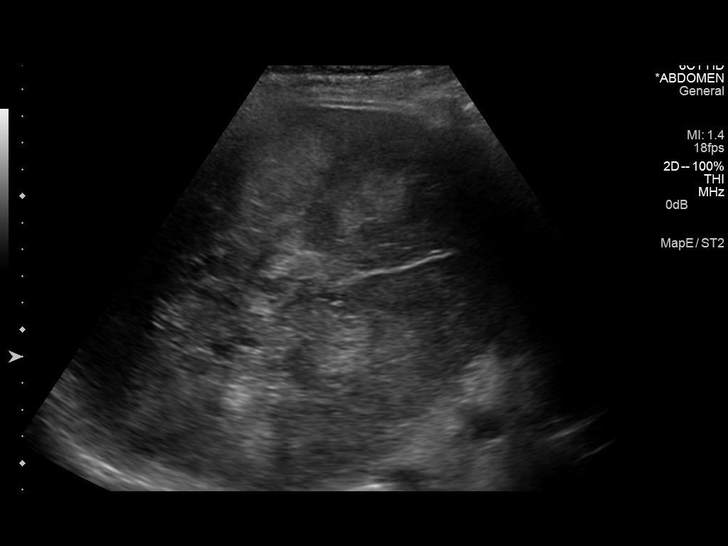
[im 2/11]
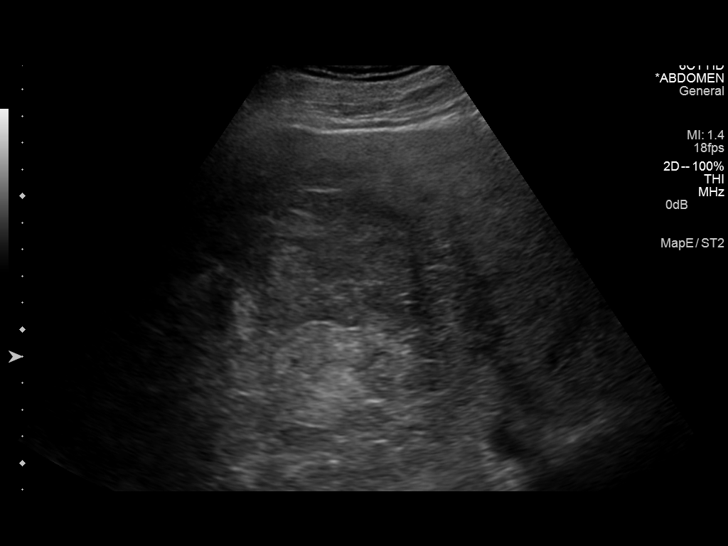
[im 3/11]
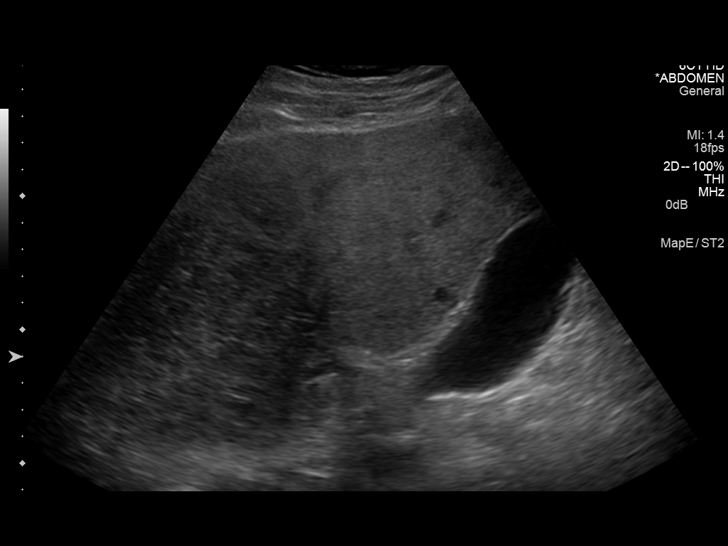
[im 4/11]
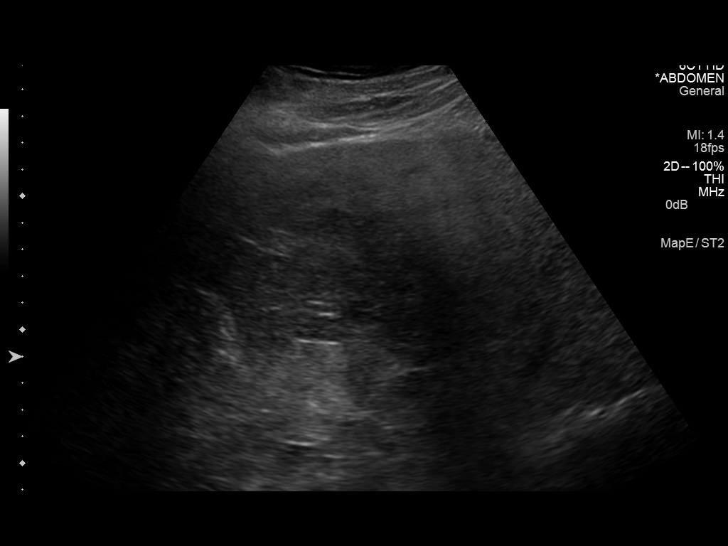
[im 5/11]
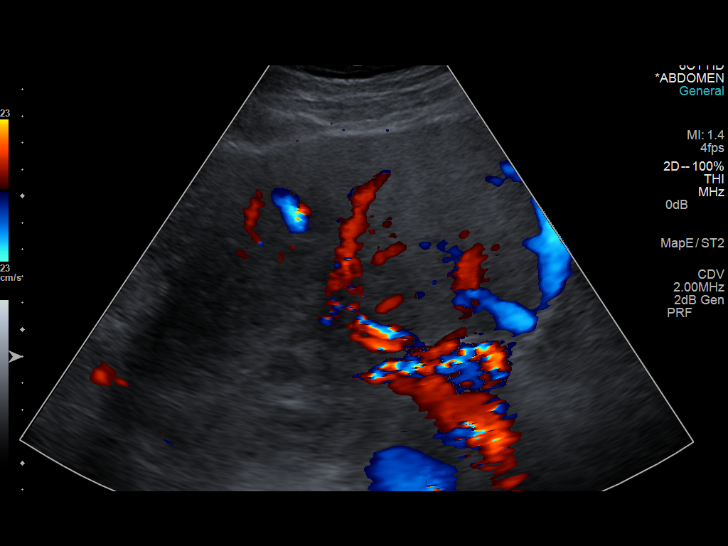
[im 6/11]
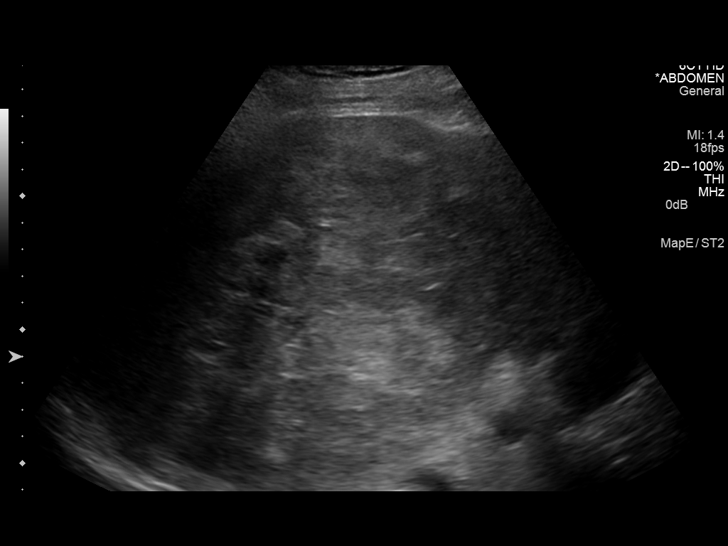
[im 7/11]
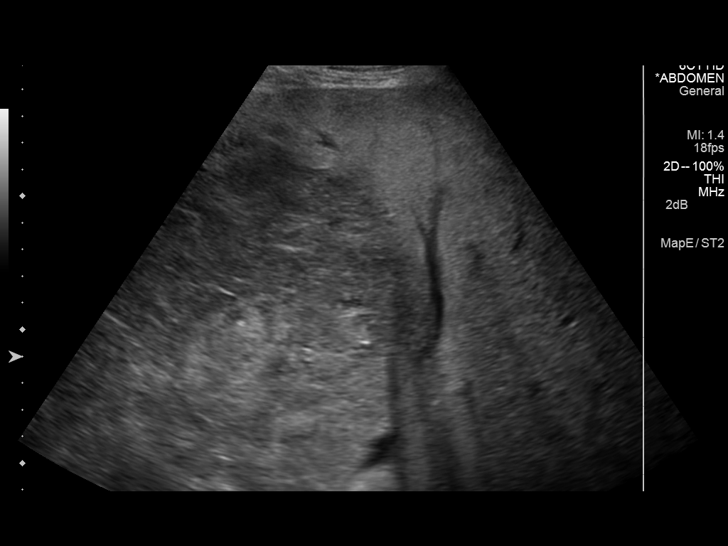
[im 8/11]
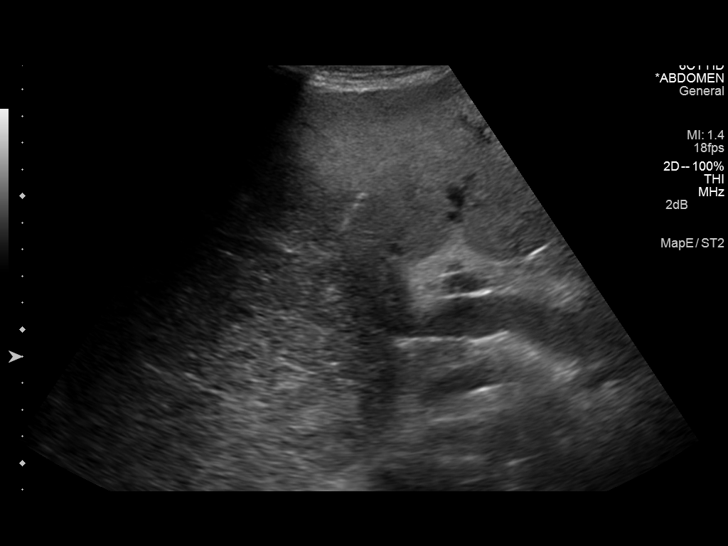
[im 9/11]
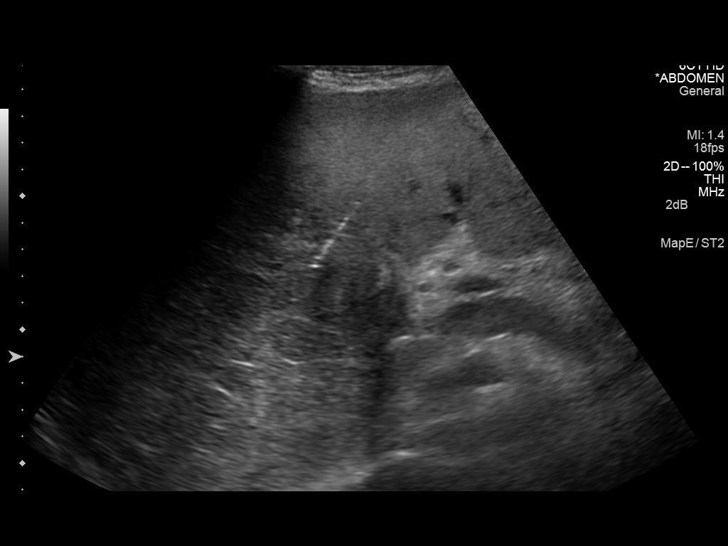
[im 10/11]
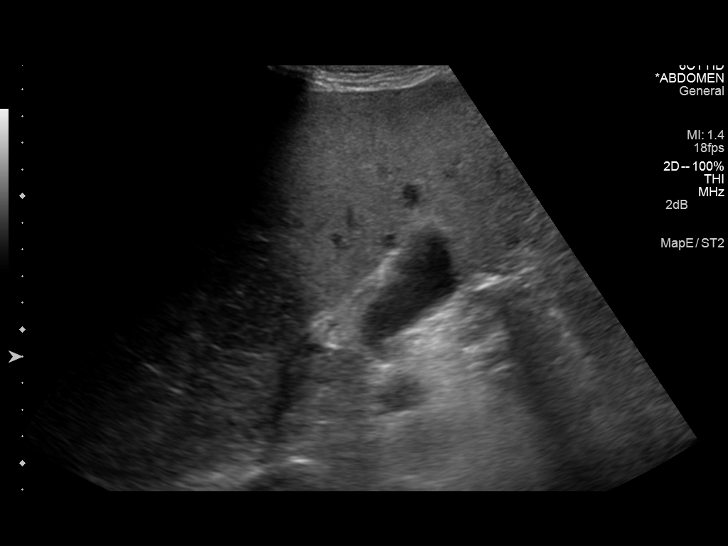
[im 11/11]
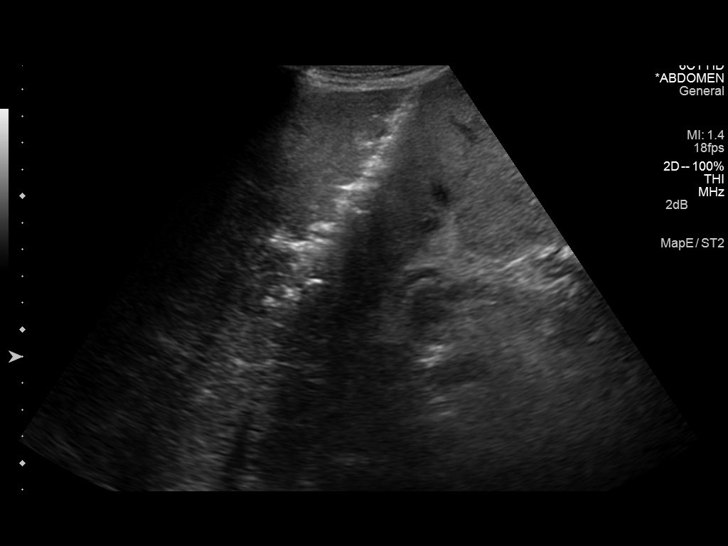

[11 of 11 positions shown; findings below may reference images not displayed]

EXAM:
ULTRASOUND-GUIDED BIOPSY OF A LIVER MASS.  CORE.

MEDICATIONS AND MEDICAL HISTORY:
Versed two mg, Fentanyl 50 mcg.

Additional Medications: None.

ANESTHESIA/SEDATION:
Moderate sedation time: 15 minutes

PROCEDURE:
The procedure, risks, benefits, and alternatives were explained to
the patient. Questions regarding the procedure were encouraged and
answered. The patient understands and consents to the procedure.

The right upper quadrant was prepped with Betadine in a sterile
fashion, and a sterile drape was applied covering the operative
field. A sterile gown and sterile gloves were used for the
procedure.

Under sonographic guidance, an 96gauge guide needle was advanced
into the right lobe liver lesion. Subsequently four 18 gauge core
biopsies were obtained. Gel-Foam slurry was injected into the tract.
Final imaging was performed.

Patient tolerated the procedure well without complication. Vital
sign monitoring by nursing staff during the procedure will continue
as patient is in the special procedures unit for post procedure
observation.
FINDINGS: The images document guide needle placement within the right lobe
liver lesion. Post biopsy images demonstrate no hemorrhage.
IMPRESSION: Successful ultrasound-guided core biopsy of a right lobe liver
lesion.
